# Patient Record
Sex: Male | Born: 1955 | Race: White | Hispanic: No | Marital: Married | Smoking: Never smoker
Health system: Southern US, Community
[De-identification: ages and names within clinical notes are randomized; demographics above are authoritative.]

## PROBLEM LIST (undated history)

## (undated) DIAGNOSIS — C801 Malignant (primary) neoplasm, unspecified: Secondary | ICD-10-CM

## (undated) DIAGNOSIS — M1990 Unspecified osteoarthritis, unspecified site: Secondary | ICD-10-CM

## (undated) DIAGNOSIS — F32A Depression, unspecified: Secondary | ICD-10-CM

## (undated) DIAGNOSIS — D689 Coagulation defect, unspecified: Secondary | ICD-10-CM

## (undated) HISTORY — DX: Unspecified osteoarthritis, unspecified site: M19.90

## (undated) HISTORY — PX: INGUINAL HERNIA REPAIR: SUR1180

## (undated) HISTORY — PX: ADENOIDECTOMY: SUR15

## (undated) HISTORY — PX: INGUINAL LYMPH NODE BIOPSY: SHX5865

## (undated) HISTORY — PX: TONSILLECTOMY: SUR1361

## (undated) HISTORY — DX: Coagulation defect, unspecified: D68.9

## (undated) HISTORY — DX: Depression, unspecified: F32.A

---

## 2007-04-03 ENCOUNTER — Ambulatory Visit: Payer: Self-pay | Admitting: General Surgery

## 2008-05-25 ENCOUNTER — Ambulatory Visit: Payer: Self-pay | Admitting: General Surgery

## 2015-01-06 ENCOUNTER — Ambulatory Visit: Payer: Self-pay | Admitting: Internal Medicine

## 2015-01-07 ENCOUNTER — Ambulatory Visit: Payer: Self-pay | Admitting: Internal Medicine

## 2015-01-09 ENCOUNTER — Ambulatory Visit: Payer: Self-pay | Admitting: Internal Medicine

## 2015-01-09 LAB — CBC WITH DIFFERENTIAL/PLATELET
BASOS ABS: 0 10*3/uL (ref 0.0–0.1)
Basophil %: 0.8 %
Eosinophil #: 0.2 10*3/uL (ref 0.0–0.7)
Eosinophil %: 3.1 %
HCT: 45.1 % (ref 40.0–52.0)
HGB: 15.4 g/dL (ref 13.0–18.0)
LYMPHS PCT: 23.9 %
Lymphocyte #: 1.3 10*3/uL (ref 1.0–3.6)
MCH: 31.2 pg (ref 26.0–34.0)
MCHC: 34.1 g/dL (ref 32.0–36.0)
MCV: 92 fL (ref 80–100)
MONOS PCT: 10.2 %
Monocyte #: 0.6 x10 3/mm (ref 0.2–1.0)
Neutrophil #: 3.4 10*3/uL (ref 1.4–6.5)
Neutrophil %: 62 %
PLATELETS: 181 10*3/uL (ref 150–440)
RBC: 4.93 10*6/uL (ref 4.40–5.90)
RDW: 13.6 % (ref 11.5–14.5)
WBC: 5.6 10*3/uL (ref 3.8–10.6)

## 2015-01-09 LAB — HEPATIC FUNCTION PANEL A (ARMC)
ALK PHOS: 98 U/L (ref 46–116)
ALT: 225 U/L — AB (ref 14–63)
Albumin: 2.9 g/dL — ABNORMAL LOW (ref 3.4–5.0)
BILIRUBIN DIRECT: 3 mg/dL — AB (ref 0.0–0.2)
BILIRUBIN TOTAL: 4 mg/dL — AB (ref 0.2–1.0)
SGOT(AST): 138 U/L — ABNORMAL HIGH (ref 15–37)
TOTAL PROTEIN: 6.9 g/dL (ref 6.4–8.2)

## 2015-01-09 LAB — BASIC METABOLIC PANEL
Anion Gap: 7 (ref 7–16)
BUN: 14 mg/dL (ref 7–18)
Calcium, Total: 8.7 mg/dL (ref 8.5–10.1)
Chloride: 102 mmol/L (ref 98–107)
Co2: 30 mmol/L (ref 21–32)
Creatinine: 0.95 mg/dL (ref 0.60–1.30)
EGFR (African American): >60
EGFR (Non-African Amer.): >60
Glucose: 95 mg/dL (ref 65–99)
OSMOLALITY: 278 (ref 275–301)
Potassium: 3.5 mmol/L (ref 3.5–5.1)
SODIUM: 139 mmol/L (ref 136–145)

## 2015-01-09 LAB — PROTIME-INR
INR: 0.9
Prothrombin Time: 12.4 secs

## 2015-01-09 LAB — GAMMA GT: GGT: 130 U/L — AB (ref 5–85)

## 2015-01-09 LAB — SEDIMENTATION RATE: ERYTHROCYTE SED RATE: 37 mm/h — AB (ref 0–20)

## 2015-01-11 DIAGNOSIS — R7989 Other specified abnormal findings of blood chemistry: Secondary | ICD-10-CM | POA: Insufficient documentation

## 2015-01-13 ENCOUNTER — Ambulatory Visit: Payer: Self-pay | Admitting: Gastroenterology

## 2019-09-07 ENCOUNTER — Other Ambulatory Visit (HOSPITAL_COMMUNITY): Payer: Self-pay | Admitting: Unknown Physician Specialty

## 2019-09-07 ENCOUNTER — Other Ambulatory Visit: Payer: Self-pay | Admitting: Unknown Physician Specialty

## 2019-09-07 DIAGNOSIS — R221 Localized swelling, mass and lump, neck: Secondary | ICD-10-CM

## 2019-09-15 ENCOUNTER — Ambulatory Visit
Admission: RE | Admit: 2019-09-15 | Discharge: 2019-09-15 | Disposition: A | Discharge status: Home / Self Care | Payer: Medicare Other | Source: Ambulatory Visit | Attending: Unknown Physician Specialty | Admitting: Unknown Physician Specialty

## 2019-09-15 ENCOUNTER — Other Ambulatory Visit: Payer: Self-pay

## 2019-09-15 DIAGNOSIS — R221 Localized swelling, mass and lump, neck: Secondary | ICD-10-CM | POA: Diagnosis not present

## 2019-09-15 LAB — POCT I-STAT CREATININE: Creatinine, Ser: 1 mg/dL (ref 0.61–1.24)

## 2019-09-15 MED ORDER — IOHEXOL 300 MG/ML  SOLN
75.0000 mL | Freq: Once | INTRAMUSCULAR | Status: AC | PRN
  Administered 2019-09-15: 14:00:00 75 mL via INTRAVENOUS

## 2019-09-19 ENCOUNTER — Inpatient Hospital Stay: Admit: 2019-09-19 | Payer: Medicare Other | Admitting: Internal Medicine

## 2019-09-21 ENCOUNTER — Other Ambulatory Visit: Payer: Self-pay | Admitting: Unknown Physician Specialty

## 2019-09-21 DIAGNOSIS — R590 Localized enlarged lymph nodes: Secondary | ICD-10-CM

## 2019-09-29 ENCOUNTER — Other Ambulatory Visit: Payer: Self-pay | Admitting: Physician Assistant

## 2019-09-30 ENCOUNTER — Ambulatory Visit
Admission: RE | Admit: 2019-09-30 | Discharge: 2019-09-30 | Disposition: A | Discharge status: Home / Self Care | Payer: Medicare Other | Source: Ambulatory Visit | Attending: Unknown Physician Specialty | Admitting: Unknown Physician Specialty

## 2019-09-30 ENCOUNTER — Other Ambulatory Visit: Payer: Self-pay

## 2019-09-30 DIAGNOSIS — C969 Malignant neoplasm of lymphoid, hematopoietic and related tissue, unspecified: Secondary | ICD-10-CM | POA: Insufficient documentation

## 2019-09-30 DIAGNOSIS — R590 Localized enlarged lymph nodes: Secondary | ICD-10-CM

## 2019-09-30 MED ORDER — SODIUM CHLORIDE 0.9 % IV SOLN: INTRAVENOUS | Status: DC

## 2019-09-30 NOTE — Procedures (Signed)
Interventional Radiology Procedure:   Indications:  2.5 cm mass at the right glossotonsillar sulcus and cervical lymphadenopathy  Procedure: US guided biopsy of right cervical lymph node  Findings: Several enlarged lymph nodes on right side of neck.  6 cores obtained from one node.   Complications: None     EBL: Minimal, less than 10 ml  Plan: Discharge to home.     Nava Song R. Anselm Pancoast, MD  Pager: [phone]

## 2019-10-01 DIAGNOSIS — C099 Malignant neoplasm of tonsil, unspecified: Secondary | ICD-10-CM | POA: Insufficient documentation

## 2019-10-01 NOTE — Progress Notes (Signed)
[place]  [phone]) [phone] Fax:(336) [phone]  ID: SHAWAN CORELLA OB: 1956/01/24  MR#: [id number]  WHQ#:[id number]  Patient Care Team: Albina Billet, MD as PCP - General (Internal Medicine)  CHIEF COMPLAINT: Right tonsil squamous carcinoma.  INTERVAL HISTORY: Patient is a 63 year old male who was having a persistent sore throat and increased cervical lymphadenopathy.  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  He is anxious, but otherwise feels well.  He has no neurologic complaints.  He denies any recent fevers or illnesses.  He has a good appetite and denies weight loss.  He has no chest pain, shortness of breath, cough, or hemoptysis.  He has no nausea, vomiting, constipation, or diarrhea.  He has no urinary complaints.  Patient feels at his baseline offers no specific complaints today.  REVIEW OF SYSTEMS:   Review of Systems  Constitutional: Negative.  Negative for fever, malaise/fatigue and weight loss.  Respiratory: Negative.  Negative for cough, hemoptysis and shortness of breath.   Cardiovascular: Negative.  Negative for chest pain and leg swelling.  Gastrointestinal: Negative.  Negative for abdominal pain.  Genitourinary: Negative.  Negative for dysuria.  Musculoskeletal: Negative.  Negative for back pain.  Skin: Negative.  Negative for rash.  Neurological: Negative.  Negative for dizziness, focal weakness, weakness and headaches.  Psychiatric/Behavioral: Negative.  The patient is not nervous/anxious.     As per HPI. Otherwise, a complete review of systems is negative.  PAST MEDICAL HISTORY: History reviewed. No pertinent past medical history.  PAST SURGICAL HISTORY: Past Surgical History:  Procedure Laterality Date   ADENOIDECTOMY     INGUINAL HERNIA REPAIR Left    INGUINAL LYMPH NODE BIOPSY Right    removal of lymph node   TONSILLECTOMY      FAMILY HISTORY: Family History  Problem Relation Age  of Onset   Multiple myeloma Brother     ADVANCED DIRECTIVES (Y/N):  N  HEALTH MAINTENANCE: Social History   Tobacco Use   Smoking status: Never Smoker   Smokeless tobacco: Never Used  Substance Use Topics   Alcohol use: Yes   Drug use: Not Currently     Colonoscopy:  PAP:  Bone density:  Lipid panel:  No Known Allergies  No current outpatient medications on file.   No current facility-administered medications for this visit.     OBJECTIVE: Vitals:   10/02/19 0950  BP: 134/70  Pulse: 63  Resp: 18  Temp: 98.1 F (36.7 C)     Body mass index is 31.52 kg/m.    ECOG FS:0 - Asymptomatic  General: Well-developed, well-nourished, no acute distress. Eyes: Pink conjunctiva, anicteric sclera. HEENT: Normocephalic, moist mucous membranes, clear oropharnyx.  No palpable lymphadenopathy. Lungs: Clear to auscultation bilaterally. Heart: Regular rate and rhythm. No rubs, murmurs, or gallops. Abdomen: Soft, nontender, nondistended. No organomegaly noted, normoactive bowel sounds. Musculoskeletal: No edema, cyanosis, or clubbing. Neuro: Alert, answering all questions appropriately. Cranial nerves grossly intact. Skin: No rashes or petechiae noted. Psych: Normal affect.  LAB RESULTS:  Lab Results  Component Value Date   NA 140 10/02/2019   K 4.5 10/02/2019   CL 107 10/02/2019   CO2 27 10/02/2019   GLUCOSE 101 (H) 10/02/2019   BUN 12 10/02/2019   CREATININE 0.90 10/02/2019   CALCIUM 9.2 10/02/2019   PROT 7.1 10/02/2019   ALBUMIN 4.3 10/02/2019   AST 22 10/02/2019   ALT 21 10/02/2019   ALKPHOS 58 10/02/2019   BILITOT 1.0 10/02/2019  GFRNONAA >60 10/02/2019   GFRAA >60 10/02/2019    Lab Results  Component Value Date   WBC 6.0 10/02/2019   NEUTROABS 4.1 10/02/2019   HGB 15.9 10/02/2019   HCT 45.7 10/02/2019   MCV 90.1 10/02/2019   PLT 161 10/02/2019     STUDIES: Ct Soft Tissue Neck W Contrast  Result Date: 09/16/2019 CLINICAL DATA:  Right  submandibular mass for 1 month EXAM: CT NECK WITH CONTRAST TECHNIQUE: Multidetector CT imaging of the neck was performed using the standard protocol following the bolus administration of intravenous contrast. CONTRAST:  38m OMNIPAQUE IOHEXOL 300 MG/ML  SOLN COMPARISON:  None. FINDINGS: Pharynx and larynx: 2.5 cm enhancing mass at the right glossotonsillar sulcus with submucosal extension into the tongue musculature. No bony or carotid space invasion. Salivary glands: Negative for mass or inflammation. Thyroid: Subcentimeter right thyroid nodule, incidental Lymph nodes: 4 enlarged and rounded right upper jugular chain lymph nodes measuring up to 17 mm in diameter. No contralateral adenopathy is seen. Vascular: Negative Limited intracranial: Negative Visualized orbits: Negative Mastoids and visualized paranasal sinuses: Polypoid densities in the bilateral nasal cavity. There has been endoscopic sinus surgery previously. Skeleton: No acute or aggressive finding. Cervical degenerative disc narrowing. Upper chest: Negative IMPRESSION: 2.5 cm mass at the right glossotonsillar sulcus consistent with carcinoma. There are multiple ipsilateral lymph nodes in the upper jugular chain measuring up to 17 mm. Prior endoscopic sinus surgery.  There are bilateral nasal polyps. Electronically Signed   By: JMonte FantasiaM.D.   On: 09/16/2019 08:47   [place] Biopsy (lymph Nodes)  Result Date: 09/30/2019 INDICATION: 63year old with a mass at the right glossotonsillar sulcus and right cervical lymphadenopathy. Tissue diagnosis is needed. EXAM: ULTRASOUND-GUIDED RIGHT CERVICAL LYMPH NODE BIOPSY MEDICATIONS: None. ANESTHESIA/SEDATION: None FLUOROSCOPY TIME:  None COMPLICATIONS: None immediate. PROCEDURE: Informed written consent was obtained from the patient after a thorough discussion of the procedural risks, benefits and alternatives. All questions were addressed. A timeout was performed prior to the initiation of the procedure.  Ultrasound was used to evaluate the right side of the neck. Several abnormal hypoechoic lymph nodes were identified. Lymph node superficial to the right internal jugular vein and right carotid artery was targeted. Skin was prepped with chlorhexidine and sterile field was created. Skin was anesthetized with 1% lidocaine. Using ultrasound guidance, core biopsies were obtained from the targeted lymph node with an 18 gauge device. Specimens placed on Telfa pad with saline. Bandage placed over the puncture site. FINDINGS: Several rounded hypoechoic lymph nodes in the right neck. Six core biopsies obtained and no immediate bleeding or hematoma formation. IMPRESSION: Ultrasound-guided core biopsies of a right cervical lymph node. Electronically Signed   By: AMarkus DaftM.D.   On: 09/30/2019 11:10    ASSESSMENT: Right tonsil squamous carcinoma.  PLAN:    1. Right tonsil squamous carcinoma: CT scan results from September 16, 2019 reviewed independently and reported as above.  Biopsy confirmed squamous cell carcinoma.  Patient appears to be stage IVa which can be treated with weekl cisplatin concurrently with XRT.  Will get a PET scan in the next week to complete the staging work-up.  A referral to radiation oncology has also been placed.  Patient will have a video assisted telemedicine visit 1 to 2 days after his PET scan to discuss the results and additional treatment planning.  I spent a total of 60 minutes face-to-face with the patient of which greater than 50% of the visit was spent in counseling and  coordination of care as detailed above.   Patient expressed understanding and was in agreement with this plan. He also understands that He can call clinic at any time with any questions, concerns, or complaints.   Cancer Staging No matching staging information was found for the patient.  Lloyd Huger, MD   10/03/2019 6:57 AM

## 2019-10-02 ENCOUNTER — Inpatient Hospital Stay: Payer: Medicare Other

## 2019-10-02 ENCOUNTER — Encounter: Payer: Self-pay | Admitting: Oncology

## 2019-10-02 ENCOUNTER — Other Ambulatory Visit: Payer: Self-pay

## 2019-10-02 ENCOUNTER — Inpatient Hospital Stay: Payer: Medicare Other | Attending: Oncology | Admitting: Oncology

## 2019-10-02 DIAGNOSIS — C099 Malignant neoplasm of tonsil, unspecified: Secondary | ICD-10-CM | POA: Insufficient documentation

## 2019-10-02 LAB — COMPREHENSIVE METABOLIC PANEL
ALT: 21 U/L (ref 0–44)
AST: 22 U/L (ref 15–41)
Albumin: 4.3 g/dL (ref 3.5–5.0)
Alkaline Phosphatase: 58 U/L (ref 38–126)
Anion gap: 6 (ref 5–15)
BUN: 12 mg/dL (ref 8–23)
CO2: 27 mmol/L (ref 22–32)
Calcium: 9.2 mg/dL (ref 8.9–10.3)
Chloride: 107 mmol/L (ref 98–111)
Creatinine, Ser: 0.9 mg/dL (ref 0.61–1.24)
GFR calc Af Amer: >60 mL/min (ref >60)
GFR calc non Af Amer: >60 mL/min (ref >60)
Glucose, Bld: 101 mg/dL — ABNORMAL HIGH (ref 70–99)
Potassium: 4.5 mmol/L (ref 3.5–5.1)
Sodium: 140 mmol/L (ref 135–145)
Total Bilirubin: 1 mg/dL (ref 0.3–1.2)
Total Protein: 7.1 g/dL (ref 6.5–8.1)

## 2019-10-02 LAB — CBC WITH DIFFERENTIAL/PLATELET
Abs Immature Granulocytes: 0.02 10*3/uL (ref 0.00–0.07)
Basophils Absolute: 0 10*3/uL (ref 0.0–0.1)
Basophils Relative: 1 %
Eosinophils Absolute: 0.1 10*3/uL (ref 0.0–0.5)
Eosinophils Relative: 2 %
HCT: 45.7 % (ref 39.0–52.0)
Hemoglobin: 15.9 g/dL (ref 13.0–17.0)
Immature Granulocytes: 0 %
Lymphocytes Relative: 19 %
Lymphs Abs: 1.1 10*3/uL (ref 0.7–4.0)
MCH: 31.4 pg (ref 26.0–34.0)
MCHC: 34.8 g/dL (ref 30.0–36.0)
MCV: 90.1 fL (ref 80.0–100.0)
Monocytes Absolute: 0.6 10*3/uL (ref 0.1–1.0)
Monocytes Relative: 10 %
Neutro Abs: 4.1 10*3/uL (ref 1.7–7.7)
Neutrophils Relative %: 68 %
Platelets: 161 10*3/uL (ref 150–400)
RBC: 5.07 MIL/uL (ref 4.22–5.81)
RDW: 13 % (ref 11.5–15.5)
WBC: 6 10*3/uL (ref 4.0–10.5)
nRBC: 0 % (ref 0.0–0.2)

## 2019-10-02 LAB — SURGICAL PATHOLOGY

## 2019-10-02 NOTE — Progress Notes (Signed)
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

## 2019-10-04 NOTE — Progress Notes (Signed)
[place]  Telephone:(336) [phone] Fax:(336) [phone]  ID: Bryan Gomez OB: [birth year]  MR#: [id number]  RKY#:[id number]  Patient Care Team: Albina Billet, MD as PCP - General (Internal Medicine)  I connected with Bryan Gomez on 10/10/19 at  9:45 AM EDT by video enabled telemedicine visit and verified that I am speaking with the correct person using two identifiers.   I discussed the limitations, risks, security and privacy concerns of performing an evaluation and management service by telemedicine and the availability of in-person appointments. I also discussed with the patient that there may be a patient responsible charge related to this service. The patient expressed understanding and agreed to proceed.   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  INTERVAL HISTORY: Patient agreed to video enabled telemedicine visit for discussion of his imaging results and treatment planning.  He currently feels well and is asymptomatic.  He has no neurologic complaints.  He denies any recent fevers or illnesses.  He has a good appetite and denies weight loss.  He has no chest pain, shortness of breath, cough, or hemoptysis.  He has no nausea, vomiting, constipation, or diarrhea.  He has no urinary complaints.  Patient offers no specific complaints today.  REVIEW OF SYSTEMS:   Review of Systems  Constitutional: Negative.  Negative for fever, malaise/fatigue and weight loss.  Respiratory: Negative.  Negative for cough, hemoptysis and shortness of breath.   Cardiovascular: Negative.  Negative for chest pain and leg swelling.  Gastrointestinal: Negative.  Negative for abdominal pain.  Genitourinary: Negative.  Negative for dysuria.  Musculoskeletal: Negative.  Negative for back pain.  Skin:  Negative.  Negative for rash.  Neurological: Negative.  Negative for dizziness, focal weakness, weakness and headaches.  Psychiatric/Behavioral: Negative.  The patient is not nervous/anxious.     As per HPI. Otherwise, a complete review of systems is negative.  PAST MEDICAL HISTORY: No past medical history on file.  PAST SURGICAL HISTORY: Past Surgical History:  Procedure Laterality Date   ADENOIDECTOMY     INGUINAL HERNIA REPAIR Left    INGUINAL LYMPH NODE BIOPSY Right    removal of lymph node   TONSILLECTOMY      FAMILY HISTORY: Family History  Problem Relation Age of Onset   Multiple myeloma Brother     ADVANCED DIRECTIVES (Y/N):  N  HEALTH MAINTENANCE: Social History   Tobacco Use   Smoking status: Never Smoker   Smokeless tobacco: Never Used  Substance Use Topics   Alcohol use: Yes   Drug use: Not Currently     Colonoscopy:  PAP:  Bone density:  Lipid panel:  No Known Allergies  Current Outpatient Medications  Medication Sig Dispense Refill   chlorpheniramine (CHLOR-TRIMETON) 2 MG/5ML syrup Take 2 mg by mouth every 4 (four) hours as needed for allergies.     diclofenac sodium (VOLTAREN) 1 % GEL Voltaren 1 % topical gel  APPLY 2 GRAM TO THE AFFECTED AREA(S) BY TOPICAL ROUTE 4 TIMES PER DAY     SILDENAFIL CITRATE PO CHEW 1 TO 2 TABLETS BY MOUTH 30 TO 45 MINUTES BEFORE SEXUAL ACTIVITY AS NEEDED     ondansetron (ZOFRAN) 8 MG tablet Take 1 tablet (8 mg total) by mouth 2 (two) times daily as needed. 60 tablet 1   prochlorperazine (COMPAZINE) 10 MG tablet Take  1 tablet (10 mg total) by mouth every 6 (six) hours as needed (Nausea or vomiting). 60 tablet 1   No current facility-administered medications for this visit.     OBJECTIVE: There were no vitals filed for this visit.   There is no height or weight on file to calculate BMI.    ECOG FS:0 - Asymptomatic  General: Well-developed, well-nourished, no acute distress. HEENT:  Normocephali. Neuro: Alert, answering all questions appropriately. Cranial nerves grossly intact. Psych: Normal affect.  LAB RESULTS:  Lab Results  Component Value Date   NA 140 10/02/2019   K 4.5 10/02/2019   CL 107 10/02/2019   CO2 27 10/02/2019   GLUCOSE 101 (H) 10/02/2019   BUN 12 10/02/2019   CREATININE 0.90 10/02/2019   CALCIUM 9.2 10/02/2019   PROT 7.1 10/02/2019   ALBUMIN 4.3 10/02/2019   AST 22 10/02/2019   ALT 21 10/02/2019   ALKPHOS 58 10/02/2019   BILITOT 1.0 10/02/2019   GFRNONAA >60 10/02/2019   GFRAA >60 10/02/2019    Lab Results  Component Value Date   WBC 6.0 10/02/2019   NEUTROABS 4.1 10/02/2019   HGB 15.9 10/02/2019   HCT 45.7 10/02/2019   MCV 90.1 10/02/2019   PLT 161 10/02/2019     STUDIES: Ct Soft Tissue Neck W Contrast  Result Date: 09/16/2019 CLINICAL DATA:  Right submandibular mass for 1 month EXAM: CT NECK WITH CONTRAST TECHNIQUE: Multidetector CT imaging of the neck was performed using the standard protocol following the bolus administration of intravenous contrast. CONTRAST:  46m OMNIPAQUE IOHEXOL 300 MG/ML  SOLN COMPARISON:  None. FINDINGS: Pharynx and larynx: 2.5 cm enhancing mass at the right glossotonsillar sulcus with submucosal extension into the tongue musculature. No bony or carotid space invasion. Salivary glands: Negative for mass or inflammation. Thyroid: Subcentimeter right thyroid nodule, incidental Lymph nodes: 4 enlarged and rounded right upper jugular chain lymph nodes measuring up to 17 mm in diameter. No contralateral adenopathy is seen. Vascular: Negative Limited intracranial: Negative Visualized orbits: Negative Mastoids and visualized paranasal sinuses: Polypoid densities in the bilateral nasal cavity. There has been endoscopic sinus surgery previously. Skeleton: No acute or aggressive finding. Cervical degenerative disc narrowing. Upper chest: Negative IMPRESSION: 2.5 cm mass at the right glossotonsillar sulcus consistent with  carcinoma. There are multiple ipsilateral lymph nodes in the upper jugular chain measuring up to 17 mm. Prior endoscopic sinus surgery.  There are bilateral nasal polyps. Electronically Signed   By: JMonte FantasiaM.D.   On: 09/16/2019 08:47   Nm Pet Image Initial (pi) Skull Base To Thigh  Result Date: 10/07/2019 CLINICAL DATA:  Initial treatment strategy for head and neck cancer. EXAM: NUCLEAR MEDICINE PET SKULL BASE TO THIGH TECHNIQUE: 12.4 mCi F-18 FDG was injected intravenously. Full-ring PET imaging was performed from the skull base to thigh after the radiotracer. CT data was obtained and used for attenuation correction and anatomic localization. Fasting blood glucose: 100 mg/dl COMPARISON:  CT neck 09/15/2019 FINDINGS: Mediastinal blood pool activity: SUV max 2.68 Liver activity: SUV max NA NECK: Asymmetric increased uptake localizing to the right glossotonsillar sulcus mass has an SUV max of 12.24. Multiple FDG avid right cervical lymph nodes identified. Index right level 2 node measures 2 cm and has an SUV max of 6.8. No FDG avid left cervical lymph nodes. Incidental CT findings: none CHEST: No hypermetabolic mediastinal or hilar nodes. No suspicious pulmonary nodules on the CT scan. Incidental CT findings: Dependent changes and subsegmental atelectasis noted in the posterior  lung bases. Lad coronary artery calcifications. Aortic atherosclerosis. ABDOMEN/PELVIS: No abnormal hypermetabolic activity within the liver, pancreas, adrenal glands, or spleen. No hypermetabolic lymph nodes in the abdomen or pelvis. Incidental CT findings: Fat containing umbilical hernia. Left kidney stones. Aortic atherosclerosis. SKELETON: No focal hypermetabolic activity to suggest skeletal metastasis. Incidental CT findings: none IMPRESSION: 1. There is marked increased radiotracer uptake localizing to the right glossotonsillar sulcus mass identified on recent CT. SUV max is equal to 12.24. 2. Multiple right cervical lymph  nodes are FDG avid compatible with metastatic adenopathy. No FDG avid left cervical lymph nodes or distant metastatic disease. 3. Left kidney stones. 4. Lad coronary artery atherosclerotic calcifications. Electronically Signed   By: Kerby Moors M.D.   On: 10/07/2019 11:40   [place] Core Biopsy (lymph Nodes)  Result Date: 09/30/2019 INDICATION: 63 year old with a mass at the right glossotonsillar sulcus and right cervical lymphadenopathy. Tissue diagnosis is needed. EXAM: ULTRASOUND-GUIDED RIGHT CERVICAL LYMPH NODE BIOPSY MEDICATIONS: None. ANESTHESIA/SEDATION: None FLUOROSCOPY TIME:  None COMPLICATIONS: None immediate. PROCEDURE: Informed written consent was obtained from the patient after a thorough discussion of the procedural risks, benefits and alternatives. All questions were addressed. A timeout was performed prior to the initiation of the procedure. Ultrasound was used to evaluate the right side of the neck. Several abnormal hypoechoic lymph nodes were identified. Lymph node superficial to the right internal jugular vein and right carotid artery was targeted. Skin was prepped with chlorhexidine and sterile field was created. Skin was anesthetized with 1% lidocaine. Using ultrasound guidance, core biopsies were obtained from the targeted lymph node with an 18 gauge device. Specimens placed on Telfa pad with saline. Bandage placed over the puncture site. FINDINGS: Several rounded hypoechoic lymph nodes in the right neck. Six core biopsies obtained and no immediate bleeding or hematoma formation. IMPRESSION: Ultrasound-guided core biopsies of a right cervical lymph node. Electronically Signed   By: Markus Daft M.D.   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  PLAN:    1. 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  Patient has no  obvious metastatic disease.  He recently had consultation with radiation oncology and plan is to initiate concurrent weekly cisplatin 40 mg/m along with daily XRT.  Return to clinic on October 21, 2019 to initiate cycle 1 of weekly cisplatin.    I provided 25 minutes of face-to-face video visit time during this encounter, and > 50% was spent counseling as documented under my assessment & plan.   Patient expressed understanding and was in agreement with this plan. He also understands that He can call clinic at any time with any questions, concerns, or complaints.    Lloyd Huger, MD   10/10/2019 7:14 AM

## 2019-10-07 ENCOUNTER — Encounter
Admission: RE | Admit: 2019-10-07 | Discharge: 2019-10-07 | Disposition: A | Discharge status: Home / Self Care | Payer: Medicare Other | Source: Ambulatory Visit | Attending: Oncology | Admitting: Oncology

## 2019-10-07 ENCOUNTER — Other Ambulatory Visit: Payer: Self-pay

## 2019-10-07 DIAGNOSIS — I2510 Atherosclerotic heart disease of native coronary artery without angina pectoris: Secondary | ICD-10-CM | POA: Diagnosis not present

## 2019-10-07 DIAGNOSIS — C099 Malignant neoplasm of tonsil, unspecified: Secondary | ICD-10-CM | POA: Diagnosis present

## 2019-10-07 DIAGNOSIS — N200 Calculus of kidney: Secondary | ICD-10-CM | POA: Insufficient documentation

## 2019-10-07 LAB — GLUCOSE, CAPILLARY: Glucose-Capillary: 100 mg/dL — ABNORMAL HIGH (ref 70–99)

## 2019-10-07 MED ORDER — FLUDEOXYGLUCOSE F - 18 (FDG) INJECTION
12.7000 | Freq: Once | INTRAVENOUS | Status: AC | PRN
  Administered 2019-10-07: 12.4 via INTRAVENOUS

## 2019-10-08 ENCOUNTER — Ambulatory Visit
Admission: RE | Admit: 2019-10-08 | Discharge: 2019-10-08 | Disposition: A | Discharge status: Home / Self Care | Payer: Medicare Other | Source: Ambulatory Visit | Attending: Radiation Oncology | Admitting: Radiation Oncology

## 2019-10-08 ENCOUNTER — Encounter: Payer: Self-pay | Admitting: Radiation Oncology

## 2019-10-08 ENCOUNTER — Other Ambulatory Visit: Payer: Self-pay

## 2019-10-08 ENCOUNTER — Encounter: Payer: Self-pay | Admitting: Oncology

## 2019-10-08 VITALS — BP 136/63 | HR 58 | Temp 97.8°F | Resp 16 | Wt 248.9 lb

## 2019-10-08 DIAGNOSIS — C099 Malignant neoplasm of tonsil, unspecified: Secondary | ICD-10-CM | POA: Insufficient documentation

## 2019-10-08 DIAGNOSIS — Z79899 Other long term (current) drug therapy: Secondary | ICD-10-CM | POA: Insufficient documentation

## 2019-10-08 NOTE — Progress Notes (Signed)
Patient is here for follow up via telephone call, he would like to discuss results of his PET scan

## 2019-10-08 NOTE — Consult Note (Signed)
NEW PATIENT EVALUATION  Name: Bryan Gomez  MRN: [id number]  Date:   10/08/2019     DOB: [birth year]   This 63 y.o. 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  REFERRING PHYSICIAN: Albina Billet, MD  CHIEF COMPLAINT:  Chief Complaint  Patient presents with  . Cancer    INitial consultation    DIAGNOSIS: The encounter diagnosis was Right tonsillar squamous cell carcinoma ([place]).   PREVIOUS INVESTIGATIONS:  PET/CT and CT scans reviewed Clinical notes reviewed Surgical pathology report reviewed  HPI: Patient is a 63 year old male who presented with a several month history of persistent sore throat.  He also noticed lymph nodes in his right neck.  He was seen by ENT CT scan was performed showing a 2.5 cm mass in the right glossotonsillar sulcus consistent with carcinoma.  There are also multiple ipsilateral lymph nodes in the upper jugular chain on the right.  Patient underwent ultrasound-guided biopsy of cervical lymph node which was positive for metastatic squamous cell carcinoma.  Tumor is strongly and diffusely positive for p16.  PET scan was performed showing a hypermetabolic activity in the right tonsillar region with an SUV of 12.2.  There was also multiple right cervical lymph nodes hypermetabolic compatible with metastatic adenopathy.  No left cervical lymph nodes or distant metastatic disease was seen.  Patient been seen by medical oncology and plan is for concurrent chemoradiation he is seen today for radiation oncology opinion he is doing fairly well he states his throat is still slightly sore.  He is having no dysphagia.  PLANNED TREATMENT REGIMEN: Concurrent chemoradiation  PAST MEDICAL HISTORY:  has no past medical history on file.    PAST SURGICAL HISTORY:  Past Surgical History:  Procedure Laterality Date  . ADENOIDECTOMY    . INGUINAL HERNIA REPAIR Left   .  INGUINAL LYMPH NODE BIOPSY Right    removal of lymph node  . TONSILLECTOMY      FAMILY HISTORY: family history includes Multiple myeloma in his brother.  SOCIAL HISTORY:  reports that he has never smoked. He has never used smokeless tobacco. He reports current alcohol use. He reports previous drug use.  ALLERGIES: Patient has no known allergies.  MEDICATIONS:  Current Outpatient Medications  Medication Sig Dispense Refill  . chlorpheniramine (CHLOR-TRIMETON) 2 MG/5ML syrup Take 2 mg by mouth every 4 (four) hours as needed for allergies.    Marland Kitchen diclofenac sodium (VOLTAREN) 1 % GEL Voltaren 1 % topical gel  APPLY 2 GRAM TO THE AFFECTED AREA(S) BY TOPICAL ROUTE 4 TIMES PER DAY    . SILDENAFIL CITRATE PO CHEW 1 TO 2 TABLETS BY MOUTH 30 TO 45 MINUTES BEFORE SEXUAL ACTIVITY AS NEEDED     No current facility-administered medications for this encounter.     ECOG PERFORMANCE STATUS:  1 - Symptomatic but completely ambulatory  REVIEW OF SYSTEMS: Patient denies any weight loss, fatigue, weakness, fever, chills or night sweats. Patient denies any loss of vision, blurred vision. Patient denies any ringing  of the ears or hearing loss. No irregular heartbeat. Patient denies heart murmur or history of fainting. Patient denies any chest pain or pain radiating to her upper extremities. Patient denies any shortness of breath, difficulty breathing at night, cough or hemoptysis. Patient denies any swelling in the lower legs. Patient denies any nausea vomiting, vomiting of blood, or coffee ground material in the vomitus. Patient denies  any stomach pain. Patient states has had normal bowel movements no significant constipation or diarrhea. Patient denies any dysuria, hematuria or significant nocturia. Patient denies any problems walking, swelling in the joints or loss of balance. Patient denies any skin changes, loss of hair or loss of weight. Patient denies any excessive worrying or anxiety or significant  depression. Patient denies any problems with insomnia. Patient denies excessive thirst, polyuria, polydipsia. Patient denies any swollen glands, patient denies easy bruising or easy bleeding. Patient denies any recent infections, allergies or URI. Patient "s visual fields have not changed significantly in recent time.   PHYSICAL EXAM: BP 136/63 (BP Location: Left Arm, Patient Position: Sitting)   Pulse (!) 58   Temp 97.8 F (36.6 C) (Tympanic)   Resp 16   Wt 248 lb 14.4 oz (112.9 kg)   BMI 31.96 kg/m  Oral cavity shows masslike effect in the region of the right tonsil.  He also has multiple scattered lymph nodes in the right cervical chain left neck is clear.  Well-developed well-nourished patient in NAD. HEENT reveals PERLA, EOMI, discs not visualized.  Oral cavity is clear. No oral mucosal lesions are identified. Neck is clear without evidence of cervical or supraclavicular adenopathy. Lungs are clear to A&P. Cardiac examination is essentially unremarkable with regular rate and rhythm without murmur rub or thrill. Abdomen is benign with no organomegaly or masses noted. Motor sensory and DTR levels are equal and symmetric in the upper and lower extremities. Cranial nerves II through XII are grossly intact. Proprioception is intact. No peripheral adenopathy or edema is identified. No motor or sensory levels are noted. Crude visual fields are within normal range.  LABORATORY DATA: Pathology report reviewed    RADIOLOGY RESULTS: CT scans and PET CT scans reviewed   IMPRESSION: Stage IVa squamous cell carcinoma of the right tonsillar pillar in 63 year old male  PLAN: At this time I recommended IMRT radiation therapy to his right tonsillar region as well as right cervical chain.  I believe I can spare the left neck as most recent studies have shown inclusion of a unilateral squamous cell carcinoma of the tonsil with ipsilateral neck metastasis can spare the contralateral side without significant  risk of local regional failure.  I would plan on delivering 7000 cGy area of primary tumor as well as hypermetabolic lymph nodes treating the remainder of the right cervical chain to 5400 cGy using IMRT dose painting technique.  Risks and benefits of treatment including skin reaction fatigue possible dysphagia possible oral mucositis loss of taste all were described in detail to the patient.  He seems to comprehend my treatment plan well.  I have set up simulation for next week.  We will also coordinate his chemotherapy.  There will be extra effort by both professional staff as well as technical staff to coordinate and manage concurrent chemoradiation and ensuing side effects during his treatments. Patient comprehends my treatment plan well.  I would like to take this opportunity to thank you for allowing me to participate in the care of your patient.Noreene Filbert, MD

## 2019-10-09 ENCOUNTER — Inpatient Hospital Stay (HOSPITAL_BASED_OUTPATIENT_CLINIC_OR_DEPARTMENT_OTHER): Payer: Medicare Other | Admitting: Oncology

## 2019-10-09 DIAGNOSIS — C099 Malignant neoplasm of tonsil, unspecified: Secondary | ICD-10-CM | POA: Diagnosis not present

## 2019-10-10 MED ORDER — PROCHLORPERAZINE MALEATE 10 MG PO TABS: 10.0000 mg | ORAL_TABLET | Freq: Four times a day (QID) | ORAL | 1 refills | Status: DC | PRN

## 2019-10-10 MED ORDER — ONDANSETRON HCL 8 MG PO TABS: 8.0000 mg | ORAL_TABLET | Freq: Two times a day (BID) | ORAL | 1 refills | Status: DC | PRN

## 2019-10-10 NOTE — Progress Notes (Signed)
START ON PATHWAY REGIMEN - Head and Neck     A cycle is every 7 days:     Cisplatin   **Always confirm dose/schedule in your pharmacy ordering system**  Patient Characteristics: Oropharynx, HPV Negative/Unknown, Clinically Staged, Stage IV Disease Classification: Oropharynx HPV Status: Awaiting Test Results Current Disease Status: No Distant Metastases and No Recurrent Disease AJCC T Category: T2 AJCC 8 Stage Grouping: IVA AJCC N Category: cN2c AJCC M Category: M0 Intent of Therapy: Curative Intent, Discussed with Patient

## 2019-10-12 ENCOUNTER — Telehealth: Payer: Self-pay | Admitting: Oncology

## 2019-10-12 NOTE — Telephone Encounter (Signed)
Left pt Vm about Chemo Education class scheduled for 10/15/2019. Also, informed pt that all apts could be viewed in his Mychart andthat I would leava copy of appt in [place] for hin to retrieve when he comes to his appt on 10/13/2019.

## 2019-10-13 ENCOUNTER — Other Ambulatory Visit: Payer: Self-pay

## 2019-10-13 ENCOUNTER — Ambulatory Visit
Admission: RE | Admit: 2019-10-13 | Discharge: 2019-10-13 | Disposition: A | Discharge status: Home / Self Care | Payer: Medicare Other | Source: Ambulatory Visit | Attending: Radiation Oncology | Admitting: Radiation Oncology

## 2019-10-13 DIAGNOSIS — Z510 Encounter for antineoplastic radiation therapy: Secondary | ICD-10-CM | POA: Insufficient documentation

## 2019-10-13 DIAGNOSIS — C099 Malignant neoplasm of tonsil, unspecified: Secondary | ICD-10-CM | POA: Diagnosis present

## 2019-10-14 ENCOUNTER — Other Ambulatory Visit: Payer: Self-pay

## 2019-10-14 DIAGNOSIS — Z510 Encounter for antineoplastic radiation therapy: Secondary | ICD-10-CM | POA: Diagnosis not present

## 2019-10-14 NOTE — Patient Instructions (Signed)
Cisplatin injection What is this medicine? CISPLATIN (SIS pla tin) is a chemotherapy drug. It targets fast dividing cells, like cancer cells, and causes these cells to die. This medicine is used to treat many types of cancer like bladder, ovarian, and testicular cancers. This medicine may be used for other purposes; ask your health care provider or pharmacist if you have questions. COMMON BRAND NAME(S): Platinol, Platinol -AQ What should I tell my health care provider before I take this medicine? They need to know if you have any of these conditions:  blood disorders  hearing problems  kidney disease  recent or ongoing radiation therapy  an unusual or allergic reaction to cisplatin, carboplatin, other chemotherapy, other medicines, foods, dyes, or preservatives  pregnant or trying to get pregnant  breast-feeding How should I use this medicine? This drug is given as an infusion into a vein. It is administered in a hospital or clinic by a specially trained health care professional. Talk to your pediatrician regarding the use of this medicine in children. Special care may be needed. Overdosage: If you think you have taken too much of this medicine contact a poison control center or emergency room at once. NOTE: This medicine is only for you. Do not share this medicine with others. What if I miss a dose? It is important not to miss a dose. Call your doctor or health care professional if you are unable to keep an appointment. What may interact with this medicine?  dofetilide  foscarnet  medicines for seizures  medicines to increase blood counts like filgrastim, pegfilgrastim, sargramostim  probenecid  pyridoxine used with altretamine  rituximab  some antibiotics like amikacin, gentamicin, neomycin, polymyxin B, streptomycin, tobramycin  sulfinpyrazone  vaccines  zalcitabine Talk to your doctor or health care professional before taking any of these  medicines:  acetaminophen  aspirin  ibuprofen  ketoprofen  naproxen This list may not describe all possible interactions. Give your health care provider a list of all the medicines, herbs, non-prescription drugs, or dietary supplements you use. Also tell them if you smoke, drink alcohol, or use illegal drugs. Some items may interact with your medicine. What should I watch for while using this medicine? Your condition will be monitored carefully while you are receiving this medicine. You will need important blood work done while you are taking this medicine. This drug may make you feel generally unwell. This is not uncommon, as chemotherapy can affect healthy cells as well as cancer cells. Report any side effects. Continue your course of treatment even though you feel ill unless your doctor tells you to stop. In some cases, you may be given additional medicines to help with side effects. Follow all directions for their use. Call your doctor or health care professional for advice if you get a fever, chills or sore throat, or other symptoms of a cold or flu. Do not treat yourself. This drug decreases your body's ability to fight infections. Try to avoid being around people who are sick. This medicine may increase your risk to bruise or bleed. Call your doctor or health care professional if you notice any unusual bleeding. Be careful brushing and flossing your teeth or using a toothpick because you may get an infection or bleed more easily. If you have any dental work done, tell your dentist you are receiving this medicine. Avoid taking products that contain aspirin, acetaminophen, ibuprofen, naproxen, or ketoprofen unless instructed by your doctor. These medicines may hide a fever. Do not become pregnant while  taking this medicine. Women should inform their doctor if they wish to become pregnant or think they might be pregnant. There is a potential for serious side effects to an unborn child. Talk  to your health care professional or pharmacist for more information. Do not breast-feed an infant while taking this medicine. Drink fluids as directed while you are taking this medicine. This will help protect your kidneys. Call your doctor or health care professional if you get diarrhea. Do not treat yourself. What side effects may I notice from receiving this medicine? Side effects that you should report to your doctor or health care professional as soon as possible:  allergic reactions like skin rash, itching or hives, swelling of the face, lips, or tongue  signs of infection - fever or chills, cough, sore throat, pain or difficulty passing urine  signs of decreased platelets or bleeding - bruising, pinpoint red spots on the skin, black, tarry stools, nosebleeds  signs of decreased red blood cells - unusually weak or tired, fainting spells, lightheadedness  breathing problems  changes in hearing  gout pain  low blood counts - This drug may decrease the number of white blood cells, red blood cells and platelets. You may be at increased risk for infections and bleeding.  nausea and vomiting  pain, swelling, redness or irritation at the injection site  pain, tingling, numbness in the hands or feet  problems with balance, movement  trouble passing urine or change in the amount of urine Side effects that usually do not require medical attention (report to your doctor or health care professional if they continue or are bothersome):  changes in vision  loss of appetite  metallic taste in the mouth or changes in taste This list may not describe all possible side effects. Call your doctor for medical advice about side effects. You may report side effects to FDA at 1-800-FDA-1088. Where should I keep my medicine? This drug is given in a hospital or clinic and will not be stored at home. NOTE: This sheet is a summary. It may not cover all possible information. If you have questions  about this medicine, talk to your doctor, pharmacist, or health care provider.  2020 [employer] Standard (2008-03-02 14:40:54)  

## 2019-10-15 ENCOUNTER — Inpatient Hospital Stay: Payer: Medicare Other | Attending: Oncology

## 2019-10-15 ENCOUNTER — Inpatient Hospital Stay (HOSPITAL_BASED_OUTPATIENT_CLINIC_OR_DEPARTMENT_OTHER): Payer: Medicare Other | Admitting: Oncology

## 2019-10-15 ENCOUNTER — Encounter: Payer: Self-pay | Admitting: Oncology

## 2019-10-15 ENCOUNTER — Other Ambulatory Visit: Payer: Self-pay

## 2019-10-15 DIAGNOSIS — Z87891 Personal history of nicotine dependence: Secondary | ICD-10-CM | POA: Diagnosis not present

## 2019-10-15 DIAGNOSIS — Z5111 Encounter for antineoplastic chemotherapy: Secondary | ICD-10-CM | POA: Insufficient documentation

## 2019-10-15 DIAGNOSIS — K5900 Constipation, unspecified: Secondary | ICD-10-CM | POA: Insufficient documentation

## 2019-10-15 DIAGNOSIS — Z79899 Other long term (current) drug therapy: Secondary | ICD-10-CM | POA: Insufficient documentation

## 2019-10-15 DIAGNOSIS — R110 Nausea: Secondary | ICD-10-CM | POA: Insufficient documentation

## 2019-10-15 DIAGNOSIS — E860 Dehydration: Secondary | ICD-10-CM | POA: Insufficient documentation

## 2019-10-15 DIAGNOSIS — C099 Malignant neoplasm of tonsil, unspecified: Secondary | ICD-10-CM | POA: Insufficient documentation

## 2019-10-15 NOTE — Progress Notes (Signed)
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  Chief complaint/Reason for visit- Initial Meeting for [facility], preparing for starting chemotherapy  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[place])  10/01/2019 Initial Diagnosis   Right tonsillar squamous cell carcinoma ([place])   10/21/2019 -  Chemotherapy   The patient had palonosetron (ALOXI) injection 0.25 mg, 0.25 mg, Intravenous,  Once, 0 of 7 cycles CISplatin (PLATINOL) 97 mg in sodium chloride 0.9 % 250 mL chemo infusion, 40 mg/m2 = 97 mg, Intravenous,  Once, 0 of 7 cycles fosaprepitant (EMEND) 150 mg, dexamethasone (DECADRON) 12 mg in sodium chloride 0.9 % 145 mL IVPB, , Intravenous,  Once, 0 of 7 cycles  for chemotherapy treatment.      Interval history-  Mr. Florea is a 63 year old male who presents to chemo care clinic today for initial meeting in preparation for starting chemotherapy. I introduced the chemo care clinic and we discussed that the role of the clinic is to assist those who are at an increased risk of emergency room visits and/or complications during the course of chemotherapy treatment. We discussed that the increased risk takes into account factors such as age, performance status, and co-morbidities. We also discussed that for some, this might include barriers to care such as not having a primary care provider, lack of insurance/transportation, or not being able to afford medications. We discussed that the goal of the program is to help prevent unplanned ER visits and help reduce complications during chemotherapy. We do this by discussing specific risk factors to each individual and  identifying ways that we can help improve these risk factors and reduce barriers to care.   ECOG FS:0 - Asymptomatic  Review of systems- Review of Systems  Constitutional: Negative.  Negative for chills, fever, malaise/fatigue and weight loss.  HENT: Negative for congestion, ear pain and tinnitus.   Eyes: Negative.  Negative for blurred vision and double vision.  Respiratory: Negative.  Negative for cough, sputum production and shortness of breath.   Cardiovascular: Negative.  Negative for chest pain, palpitations and leg swelling.  Gastrointestinal: Negative.  Negative for abdominal pain, constipation, diarrhea, nausea and vomiting.  Genitourinary: Negative for dysuria, frequency and urgency.  Musculoskeletal: Negative for back pain and falls.  Skin: Negative.  Negative for rash.  Neurological: Negative.  Negative for weakness and headaches.  Endo/Heme/Allergies: Negative.  Does not bruise/bleed easily.  Psychiatric/Behavioral: Negative.  Negative for depression. The patient is not nervous/anxious and does not have insomnia.     Current treatment-concurrent cisplatin and radiation.  Will begin on 10/21/2019.  No Known Allergies  No past medical history on file.  Past Surgical History:  Procedure Laterality Date   ADENOIDECTOMY     INGUINAL HERNIA REPAIR Left    INGUINAL LYMPH NODE BIOPSY Right    removal of lymph node   TONSILLECTOMY      Social History   Socioeconomic History   Marital status: Married    Spouse name: Not on file   Number of children: Not on file   Years of education: Not on file   Highest education level: Not on file  Occupational History   Not on file  Social Designer, fashion/clothing strain: Not on file   Food insecurity    Worry: Not on file    Inability: Not on file   Transportation needs    Medical: Not on file    Non-medical: Not on file  Tobacco Use   Smoking status: Never Smoker   Smokeless tobacco: Never Used  Substance  and Sexual Activity   Alcohol use: Yes   Drug use: Not Currently   Sexual activity: Not on file  Lifestyle   Physical activity    Days per week: Not on file    Minutes per session: Not on file   Stress: Not on file  Relationships   Social connections    Talks on phone: Not on file    Gets together: Not on file    Attends religious service: Not on file    Active member of club or organization: Not on file    Attends meetings of clubs or organizations: Not on file    Relationship status: Not on file   Intimate partner violence    Fear of current or ex partner: Not on file    Emotionally abused: Not on file    Physically abused: Not on file    Forced sexual activity: Not on file  Other Topics Concern   Not on file  Social History Narrative   Not on file    Family History  Problem Relation Age of Onset   Multiple myeloma Brother      Current Outpatient Medications:    chlorpheniramine (CHLOR-TRIMETON) 2 MG/5ML syrup, Take 2 mg by mouth every 4 (four) hours as needed for allergies., Disp: , Rfl:    diclofenac sodium (VOLTAREN) 1 % GEL, Voltaren 1 % topical gel  APPLY 2 GRAM TO THE AFFECTED AREA(S) BY TOPICAL ROUTE 4 TIMES PER DAY, Disp: , Rfl:    ondansetron (ZOFRAN) 8 MG tablet, Take 1 tablet (8 mg total) by mouth 2 (two) times daily as needed., Disp: 60 tablet, Rfl: 1   prochlorperazine (COMPAZINE) 10 MG tablet, Take 1 tablet (10 mg total) by mouth every 6 (six) hours as needed (Nausea or vomiting)., Disp: 60 tablet, Rfl: 1   SILDENAFIL CITRATE PO, CHEW 1 TO 2 TABLETS BY MOUTH 30 TO 45 MINUTES BEFORE SEXUAL ACTIVITY AS NEEDED, Disp: , Rfl:   Physical exam: There were no vitals filed for this visit. Physical Exam Constitutional:      Appearance: Normal appearance.  HENT:     Head: Normocephalic and atraumatic.  Eyes:     Pupils: Pupils are equal, round, and reactive to light.  Neck:     Musculoskeletal: Normal range of motion.  Cardiovascular:     Rate  and Rhythm: Normal rate and regular rhythm.     Heart sounds: Normal heart sounds. No murmur.  Pulmonary:     Effort: Pulmonary effort is normal.     Breath sounds: Normal breath sounds. No wheezing.  Abdominal:     General: Bowel sounds are normal. There is no distension.     Palpations: Abdomen is soft.     Tenderness: There is no abdominal tenderness.  Musculoskeletal: Normal range of motion.  Skin:    General: Skin is warm and dry.     Findings: No rash.  Neurological:     Mental Status: He is alert and oriented to person, place, and time.  Psychiatric:        Judgment: Judgment normal.      CMP Latest  Ref Rng & Units 10/02/2019  Glucose 70 - 99 mg/dL 101(H)  BUN 8 - 23 mg/dL 12  Creatinine 0.61 - 1.24 mg/dL 0.90  Sodium 135 - 145 mmol/L 140  Potassium 3.5 - 5.1 mmol/L 4.5  Chloride 98 - 111 mmol/L 107  CO2 22 - 32 mmol/L 27  Calcium 8.9 - 10.3 mg/dL 9.2  Total Protein 6.5 - 8.1 g/dL 7.1  Total Bilirubin 0.3 - 1.2 mg/dL 1.0  Alkaline Phos 38 - 126 U/L 58  AST 15 - 41 U/L 22  ALT 0 - 44 U/L 21   CBC Latest Ref Rng & Units 10/02/2019  WBC 4.0 - 10.5 K/uL 6.0  Hemoglobin 13.0 - 17.0 g/dL 15.9  Hematocrit 39.0 - 52.0 % 45.7  Platelets 150 - 400 K/uL 161    No images are attached to the encounter.  Ct Soft Tissue Neck W Contrast  Result Date: 09/16/2019 CLINICAL DATA:  Right submandibular mass for 1 month EXAM: CT NECK WITH CONTRAST TECHNIQUE: Multidetector CT imaging of the neck was performed using the standard protocol following the bolus administration of intravenous contrast. CONTRAST:  22m OMNIPAQUE IOHEXOL 300 MG/ML  SOLN COMPARISON:  None. FINDINGS: Pharynx and larynx: 2.5 cm enhancing mass at the right glossotonsillar sulcus with submucosal extension into the tongue musculature. No bony or carotid space invasion. Salivary glands: Negative for mass or inflammation. Thyroid: Subcentimeter right thyroid nodule, incidental Lymph nodes: 4 enlarged and rounded right  upper jugular chain lymph nodes measuring up to 17 mm in diameter. No contralateral adenopathy is seen. Vascular: Negative Limited intracranial: Negative Visualized orbits: Negative Mastoids and visualized paranasal sinuses: Polypoid densities in the bilateral nasal cavity. There has been endoscopic sinus surgery previously. Skeleton: No acute or aggressive finding. Cervical degenerative disc narrowing. Upper chest: Negative IMPRESSION: 2.5 cm mass at the right glossotonsillar sulcus consistent with carcinoma. There are multiple ipsilateral lymph nodes in the upper jugular chain measuring up to 17 mm. Prior endoscopic sinus surgery.  There are bilateral nasal polyps. Electronically Signed   By: JMonte FantasiaM.D.   On: 09/16/2019 08:47   Nm Pet Image Initial (pi) Skull Base To Thigh  Result Date: 10/07/2019 CLINICAL DATA:  Initial treatment strategy for head and neck cancer. EXAM: NUCLEAR MEDICINE PET SKULL BASE TO THIGH TECHNIQUE: 12.4 mCi F-18 FDG was injected intravenously. Full-ring PET imaging was performed from the skull base to thigh after the radiotracer. CT data was obtained and used for attenuation correction and anatomic localization. Fasting blood glucose: 100 mg/dl COMPARISON:  CT neck 09/15/2019 FINDINGS: Mediastinal blood pool activity: SUV max 2.68 Liver activity: SUV max NA NECK: Asymmetric increased uptake localizing to the right glossotonsillar sulcus mass has an SUV max of 12.24. Multiple FDG avid right cervical lymph nodes identified. Index right level 2 node measures 2 cm and has an SUV max of 6.8. No FDG avid left cervical lymph nodes. Incidental CT findings: none CHEST: No hypermetabolic mediastinal or hilar nodes. No suspicious pulmonary nodules on the CT scan. Incidental CT findings: Dependent changes and subsegmental atelectasis noted in the posterior lung bases. Lad coronary artery calcifications. Aortic atherosclerosis. ABDOMEN/PELVIS: No abnormal hypermetabolic activity within  the liver, pancreas, adrenal glands, or spleen. No hypermetabolic lymph nodes in the abdomen or pelvis. Incidental CT findings: Fat containing umbilical hernia. Left kidney stones. Aortic atherosclerosis. SKELETON: No focal hypermetabolic activity to suggest skeletal metastasis. Incidental CT findings: none IMPRESSION: 1. There is marked increased radiotracer uptake localizing to the right glossotonsillar sulcus mass identified  on recent CT. SUV max is equal to 12.24. 2. Multiple right cervical lymph nodes are FDG avid compatible with metastatic adenopathy. No FDG avid left cervical lymph nodes or distant metastatic disease. 3. Left kidney stones. 4. Lad coronary artery atherosclerotic calcifications. Electronically Signed   By: Kerby Moors M.D.   On: 10/07/2019 11:40   [place] Core Biopsy (lymph Nodes)  Result Date: 09/30/2019 INDICATION: 63 year old with a mass at the right glossotonsillar sulcus and right cervical lymphadenopathy. Tissue diagnosis is needed. EXAM: ULTRASOUND-GUIDED RIGHT CERVICAL LYMPH NODE BIOPSY MEDICATIONS: None. ANESTHESIA/SEDATION: None FLUOROSCOPY TIME:  None COMPLICATIONS: None immediate. PROCEDURE: Informed written consent was obtained from the patient after a thorough discussion of the procedural risks, benefits and alternatives. All questions were addressed. A timeout was performed prior to the initiation of the procedure. Ultrasound was used to evaluate the right side of the neck. Several abnormal hypoechoic lymph nodes were identified. Lymph node superficial to the right internal jugular vein and right carotid artery was targeted. Skin was prepped with chlorhexidine and sterile field was created. Skin was anesthetized with 1% lidocaine. Using ultrasound guidance, core biopsies were obtained from the targeted lymph node with an 18 gauge device. Specimens placed on Telfa pad with saline. Bandage placed over the puncture site. FINDINGS: Several rounded hypoechoic lymph nodes in the  right neck. Six core biopsies obtained and no immediate bleeding or hematoma formation. IMPRESSION: Ultrasound-guided core biopsies of a right cervical lymph node. Electronically Signed   By: Markus Daft M.D.   On: 09/30/2019 11:10    Assessment and plan- Patient is a 63 y.o. male who presents to [facility] for initial meeting in preparation for starting chemotherapy for the treatment of tonsil cancer.    1. Cancer-stage IV a squamous cell carcinoma of the right tonsil.  He initially presented to PCP with persistent sore throat and increased cervical lymphadenopathy.  Subsequent CT scan and biopsy confirmed metastatic squamous cell carcinoma.  Biopsy performed and confirmed stage of disease.  Plan is for him to be treated with weekly cisplatin and concurrent XRT.  PET scan revealed cervical lymphadenopathy and right tonsillar mass.   2. Chemo Care Clinic/High Risk for ER/Hospitalization during chemotherapy- We discussed the role of the chemo care clinic and identified patient specific risk factors. I discussed that patient was identified as high based on stage of disease. He is married and lives at home with his wife. He has a PCP Dr. Hall Busing whom he see's regularly.   His pmh is positive for: No past medical history on file.  His psh is positive for: Past Surgical History:  Procedure Laterality Date   ADENOIDECTOMY     INGUINAL HERNIA REPAIR Left    INGUINAL LYMPH NODE BIOPSY Right    removal of lymph node   TONSILLECTOMY     3. Social Determinants of Health- we discussed that social determinants of health may have significant impacts on health and outcomes for cancer patients.  Today we discussed specific social determinants of performance status, alcohol use, depression, financial needs, food insecurity, housing, interpersonal violence, social connections, stress, tobacco use, and transportation.  After lengthy discussion we did not identify any areas of need at this time.  We  discussed self-referral to [facility] for counseling services, psychiatry for medication management, or palliative care/symptom management as well as primary care providers.  We discussed that living with cancer can create tremendous financial burden.  We discussed options for assistance. I asked that if assistance is needed  in affording medications or paying bills to please let us know so that we can provide assistance. We discussed options for food including social services.  We will also notify [facility] to see if cancer center can provide support.  Patient informed of food pantry at cancer center and was provided with care package today.  Please notify nursing if un-met needs. We discussed referral to social work. Will also discuss with Elease Etienne to see if [place] can provide support of utility bills, etc. We discussed options for support groups at the cancer center. If interested, please notify nurse navigator to enroll. We discussed options for managing stress including healthy eating, exercise as well as participating in no charge counseling services at the cancer center and support groups.  If these are of interest, patient can notify either myself or primary nursing team. We discussed options for transportation including [employer], paratransit, bus routes, [employer] transit, taxi/uber/lyft, and cancer center van.  I have notified primary oncology team who will help assist with arranging Lucianne Lei transportation for appointments when needed. We also discussed options for transportation on short notice/acute visits.   4. Co-morbidities Complicating Care:  No past medical history on file.  5. Palliative Care- based on stage of cancer and/or identified needs today, I will refer patient to palliative care for goals of care and advanced care planning.  6.  Patient has questions regarding shingles vaccine.  Had shingles about 25 years ago and he is interested in receiving the vaccine prior to initiating  chemotherapy.  Spoke with Dr. Grayland Ormond who is okay with receiving the vaccine.  Relayed to patient.  We also discussed the role of the Symptom Management Clinic at [facility] for acute issues and methods of contacting clinic/provider. He denies needing specific assistance at this time and He will be followed by Dr. Gary Fleet, [place] (Nurse Navigator).   Visit Diagnosis 1. Personal history of tobacco use, presenting hazards to health     Patient expressed understanding and was in agreement with this plan. He also understands that He can call clinic at any time with any questions, concerns, or complaints.   A total of (25) minutes of face-to-face time was spent with this patient with greater than 50% of that time in counseling and care-coordination.  Rulon Abide, NP, AGNP-C [place] at [facility] [phone] (office)  CC: Dr. Grayland Ormond

## 2019-10-16 NOTE — Progress Notes (Signed)
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  INTERVAL HISTORY: Patient returns to clinic today for further evaluation and consideration of cycle one of weekly cisplatin.  He is anxious, but otherwise feels well. He has no neurologic complaints.  He denies any recent fevers or illnesses.  He has a good appetite and denies weight loss.  He has no chest pain, shortness of breath, cough, or hemoptysis.  He has no nausea, vomiting, constipation, or diarrhea.  He has no urinary complaints.  Patient offers no further specific complaints today.    REVIEW OF SYSTEMS:   Review of Systems  Constitutional: Negative.  Negative for fever, malaise/fatigue and weight loss.  Respiratory: Negative.  Negative for cough, hemoptysis and shortness of breath.   Cardiovascular: Negative.  Negative for chest pain and leg swelling.  Gastrointestinal: Negative.  Negative for abdominal pain.  Genitourinary: Negative.  Negative for dysuria.  Musculoskeletal: Negative.  Negative for back pain.  Skin: Negative.  Negative for rash.  Neurological: Negative.  Negative for dizziness, focal weakness, weakness and headaches.  Psychiatric/Behavioral: The patient is nervous/anxious.     As per HPI. Otherwise, a complete review of systems is negative.  PAST MEDICAL HISTORY: No past medical history on file.  PAST SURGICAL HISTORY: Past Surgical History:  Procedure Laterality Date   ADENOIDECTOMY     INGUINAL HERNIA REPAIR Left    INGUINAL LYMPH NODE BIOPSY Right    removal of lymph node   TONSILLECTOMY      FAMILY HISTORY: Family History  Problem Relation Age of Onset   Multiple myeloma Brother     ADVANCED DIRECTIVES (Y/N):  N  HEALTH MAINTENANCE: Social  History   Tobacco Use   Smoking status: Never Smoker   Smokeless tobacco: Never Used  Substance Use Topics   Alcohol use: Yes   Drug use: Not Currently     Colonoscopy:  PAP:  Bone density:  Lipid panel:  No Known Allergies  Current Outpatient Medications  Medication Sig Dispense Refill   chlorpheniramine (CHLOR-TRIMETON) 2 MG/5ML syrup Take 2 mg by mouth every 4 (four) hours as needed for allergies.     diclofenac sodium (VOLTAREN) 1 % GEL Voltaren 1 % topical gel  APPLY 2 GRAM TO THE AFFECTED AREA(S) BY TOPICAL ROUTE 4 TIMES PER DAY     ondansetron (ZOFRAN) 8 MG tablet Take 1 tablet (8 mg total) by mouth 2 (two) times daily as needed. 60 tablet 1   prochlorperazine (COMPAZINE) 10 MG tablet Take 1 tablet (10 mg total) by mouth every 6 (six) hours as needed (Nausea or vomiting). 60 tablet 1   SILDENAFIL CITRATE PO CHEW 1 TO 2 TABLETS BY MOUTH 30 TO 45 MINUTES BEFORE SEXUAL ACTIVITY AS NEEDED     No current facility-administered medications for this visit.     OBJECTIVE: Vitals:   10/21/19 0829  BP: 118/61  Pulse: 68  Temp: 97.9 F (36.6 C)  SpO2: 97%     Body mass index is 31.79 kg/m.    ECOG FS:0 - Asymptomatic  General: Well-developed, well-nourished, no acute distress. Eyes: Pink conjunctiva, anicteric sclera. HEENT: Normocephalic, moist mucous membranes. Lungs: Clear to auscultation bilaterally. Heart: Regular rate and rhythm. No rubs, murmurs, or gallops. Abdomen: Soft, nontender, nondistended. No organomegaly noted,  normoactive bowel sounds. Musculoskeletal: No edema, cyanosis, or clubbing. Neuro: Alert, answering all questions appropriately. Cranial nerves grossly intact. Skin: No rashes or petechiae noted. Psych: Normal affect.  LAB RESULTS:  Lab Results  Component Value Date   NA 139 10/21/2019   K 4.0 10/21/2019   CL 107 10/21/2019   CO2 27 10/21/2019   GLUCOSE 80 10/21/2019   BUN 13 10/21/2019   CREATININE 0.93 10/21/2019   CALCIUM 8.5  (L) 10/21/2019   PROT 7.1 10/02/2019   ALBUMIN 4.3 10/02/2019   AST 22 10/02/2019   ALT 21 10/02/2019   ALKPHOS 58 10/02/2019   BILITOT 1.0 10/02/2019   GFRNONAA >60 10/21/2019   GFRAA >60 10/21/2019    Lab Results  Component Value Date   WBC 6.3 10/21/2019   NEUTROABS 4.1 10/21/2019   HGB 15.4 10/21/2019   HCT 45.4 10/21/2019   MCV 90.6 10/21/2019   PLT 171 10/21/2019     STUDIES: Nm Pet Image Initial (pi) Skull Base To Thigh  Result Date: 10/07/2019 CLINICAL DATA:  Initial treatment strategy for head and neck cancer. EXAM: NUCLEAR MEDICINE PET SKULL BASE TO THIGH TECHNIQUE: 12.4 mCi F-18 FDG was injected intravenously. Full-ring PET imaging was performed from the skull base to thigh after the radiotracer. CT data was obtained and used for attenuation correction and anatomic localization. Fasting blood glucose: 100 mg/dl COMPARISON:  CT neck 09/15/2019 FINDINGS: Mediastinal blood pool activity: SUV max 2.68 Liver activity: SUV max NA NECK: Asymmetric increased uptake localizing to the right glossotonsillar sulcus mass has an SUV max of 12.24. Multiple FDG avid right cervical lymph nodes identified. Index right level 2 node measures 2 cm and has an SUV max of 6.8. No FDG avid left cervical lymph nodes. Incidental CT findings: none CHEST: No hypermetabolic mediastinal or hilar nodes. No suspicious pulmonary nodules on the CT scan. Incidental CT findings: Dependent changes and subsegmental atelectasis noted in the posterior lung bases. Lad coronary artery calcifications. Aortic atherosclerosis. ABDOMEN/PELVIS: No abnormal hypermetabolic activity within the liver, pancreas, adrenal glands, or spleen. No hypermetabolic lymph nodes in the abdomen or pelvis. Incidental CT findings: Fat containing umbilical hernia. Left kidney stones. Aortic atherosclerosis. SKELETON: No focal hypermetabolic activity to suggest skeletal metastasis. Incidental CT findings: none IMPRESSION: 1. There is marked  increased radiotracer uptake localizing to the right glossotonsillar sulcus mass identified on recent CT. SUV max is equal to 12.24. 2. Multiple right cervical lymph nodes are FDG avid compatible with metastatic adenopathy. No FDG avid left cervical lymph nodes or distant metastatic disease. 3. Left kidney stones. 4. Lad coronary artery atherosclerotic calcifications. Electronically Signed   By: Kerby Moors M.D.   On: 10/07/2019 11:40   [place] Core Biopsy (lymph Nodes)  Result Date: 09/30/2019 INDICATION: 63 year old with a mass at the right glossotonsillar sulcus and right cervical lymphadenopathy. Tissue diagnosis is needed. EXAM: ULTRASOUND-GUIDED RIGHT CERVICAL LYMPH NODE BIOPSY MEDICATIONS: None. ANESTHESIA/SEDATION: None FLUOROSCOPY TIME:  None COMPLICATIONS: None immediate. PROCEDURE: Informed written consent was obtained from the patient after a thorough discussion of the procedural risks, benefits and alternatives. All questions were addressed. A timeout was performed prior to the initiation of the procedure. Ultrasound was used to evaluate the right side of the neck. Several abnormal hypoechoic lymph nodes were identified. Lymph node superficial to the right internal jugular vein and right carotid artery was targeted. Skin was prepped with chlorhexidine and sterile field was created. Skin was anesthetized with 1% lidocaine. Using ultrasound guidance, core biopsies were obtained from the  targeted lymph node with an 18 gauge device. Specimens placed on Telfa pad with saline. Bandage placed over the puncture site. FINDINGS: Several rounded hypoechoic lymph nodes in the right neck. Six core biopsies obtained and no immediate bleeding or hematoma formation. IMPRESSION: Ultrasound-guided core biopsies of a right cervical lymph node. Electronically Signed   By: Markus Daft M.D.   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  PLAN:    1. Stage IVa squamous  cell carcinoma of the right tonsil: PET scan results from October 07, 2019 reviewed independently and reported as above confirming stage of disease.  Patient has no obvious metastatic disease.  Plan is to give weekly cisplatin throughout the duration of XRT which initiates tomorrow.  Proceed with cycle 1 of weekly cisplatin.  Return to clinic in 1 week for further evaluation and consideration of cycle 2.  I spent a total of 30 minutes face-to-face with the patient of which greater than 50% of the visit was spent in counseling and coordination of care as detailed above.   Patient expressed understanding and was in agreement with this plan. He also understands that He can call clinic at any time with any questions, concerns, or complaints.    Lloyd Huger, MD   10/21/2019 3:09 PM

## 2019-10-19 ENCOUNTER — Telehealth: Payer: Self-pay

## 2019-10-19 NOTE — Telephone Encounter (Addendum)
Nutrition Assessment   Reason for Assessment:  Referral from [place], RN, head and neck cancer patient   ASSESSMENT:  63 year old male with stage IV squamous cell carcinoma of the right tonsil cancer.  No past medical history.  Patient planning to start radiation and chemotherapy on 11/11.    Spoke with patient via phone this pm.  Patient reports normal appetite, eating all consistencies of foods without difficulty.      Nutrition Focused Physical Exam: deferred   Medications: zofran, compazine   Labs: glucose 101   Anthropometrics:   Height: 74 inches Weight: 248 lb on 10/29 Stable weight BMI: 31  Patient verbalized that he wants to loose weight   Estimated Energy Needs  Kcals: 2400-2800 Protein: 120-140 g Fluid: > 2.4 L   NUTRITION DIAGNOSIS: Predicted suboptimal energy intake related to tonsil cancer as evidenced by planning treatment side effects   INTERVENTION:  Reviewed RD role in treatment process.  Encouraged weight maintenance during treatment to maintain lean muscle mass Recommend screen by OT for head and neck cancer beginning concurrent radiation and chemotherapy treatment.  RD to discuss more at next follow-up   MONITORING, EVALUATION, GOAL: Patient will consume adequate calories and protein to maintain lean muscle mass   Next Visit: Thursday, Nov 19th  [facility] B. Zenia Resides, [place], [place] Registered Dietitian [phone] (pager)

## 2019-10-20 ENCOUNTER — Other Ambulatory Visit: Payer: Self-pay

## 2019-10-20 NOTE — Progress Notes (Signed)
Patient pre screened for office appointment, no questions or concerns today. 

## 2019-10-21 ENCOUNTER — Other Ambulatory Visit: Payer: Self-pay

## 2019-10-21 ENCOUNTER — Inpatient Hospital Stay (HOSPITAL_BASED_OUTPATIENT_CLINIC_OR_DEPARTMENT_OTHER): Payer: Medicare Other | Admitting: Oncology

## 2019-10-21 ENCOUNTER — Ambulatory Visit
Admission: RE | Admit: 2019-10-21 | Discharge: 2019-10-21 | Disposition: A | Discharge status: Home / Self Care | Payer: Medicare Other | Source: Ambulatory Visit | Attending: Radiation Oncology | Admitting: Radiation Oncology

## 2019-10-21 ENCOUNTER — Inpatient Hospital Stay: Payer: Medicare Other

## 2019-10-21 VITALS — BP 120/65 | HR 55

## 2019-10-21 VITALS — BP 118/61 | HR 68 | Temp 97.9°F | Wt 247.6 lb

## 2019-10-21 DIAGNOSIS — C099 Malignant neoplasm of tonsil, unspecified: Secondary | ICD-10-CM

## 2019-10-21 DIAGNOSIS — E860 Dehydration: Secondary | ICD-10-CM | POA: Diagnosis not present

## 2019-10-21 DIAGNOSIS — Z5111 Encounter for antineoplastic chemotherapy: Secondary | ICD-10-CM | POA: Diagnosis not present

## 2019-10-21 DIAGNOSIS — Z79899 Other long term (current) drug therapy: Secondary | ICD-10-CM | POA: Diagnosis not present

## 2019-10-21 DIAGNOSIS — K5900 Constipation, unspecified: Secondary | ICD-10-CM | POA: Diagnosis not present

## 2019-10-21 DIAGNOSIS — R110 Nausea: Secondary | ICD-10-CM | POA: Diagnosis not present

## 2019-10-21 LAB — CBC WITH DIFFERENTIAL/PLATELET
Abs Immature Granulocytes: 0.02 10*3/uL (ref 0.00–0.07)
Basophils Absolute: 0 10*3/uL (ref 0.0–0.1)
Basophils Relative: 1 %
Eosinophils Absolute: 0.2 10*3/uL (ref 0.0–0.5)
Eosinophils Relative: 3 %
HCT: 45.4 % (ref 39.0–52.0)
Hemoglobin: 15.4 g/dL (ref 13.0–17.0)
Immature Granulocytes: 0 %
Lymphocytes Relative: 21 %
Lymphs Abs: 1.4 10*3/uL (ref 0.7–4.0)
MCH: 30.7 pg (ref 26.0–34.0)
MCHC: 33.9 g/dL (ref 30.0–36.0)
MCV: 90.6 fL (ref 80.0–100.0)
Monocytes Absolute: 0.6 10*3/uL (ref 0.1–1.0)
Monocytes Relative: 10 %
Neutro Abs: 4.1 10*3/uL (ref 1.7–7.7)
Neutrophils Relative %: 65 %
Platelets: 171 10*3/uL (ref 150–400)
RBC: 5.01 MIL/uL (ref 4.22–5.81)
RDW: 13 % (ref 11.5–15.5)
WBC: 6.3 10*3/uL (ref 4.0–10.5)
nRBC: 0 % (ref 0.0–0.2)

## 2019-10-21 LAB — BASIC METABOLIC PANEL
Anion gap: 5 (ref 5–15)
BUN: 13 mg/dL (ref 8–23)
CO2: 27 mmol/L (ref 22–32)
Calcium: 8.5 mg/dL — ABNORMAL LOW (ref 8.9–10.3)
Chloride: 107 mmol/L (ref 98–111)
Creatinine, Ser: 0.93 mg/dL (ref 0.61–1.24)
GFR calc Af Amer: >60 mL/min (ref >60)
GFR calc non Af Amer: >60 mL/min (ref >60)
Glucose, Bld: 80 mg/dL (ref 70–99)
Potassium: 4 mmol/L (ref 3.5–5.1)
Sodium: 139 mmol/L (ref 135–145)

## 2019-10-21 MED ORDER — POTASSIUM CHLORIDE 2 MEQ/ML IV SOLN
Freq: Once | INTRAVENOUS | Status: AC
  Administered 2019-10-21: 10:00:00 via INTRAVENOUS
  Filled 2019-10-21: qty 1000

## 2019-10-21 MED ORDER — PALONOSETRON HCL INJECTION 0.25 MG/5ML
0.2500 mg | Freq: Once | INTRAVENOUS | Status: AC
  Administered 2019-10-21: 0.25 mg via INTRAVENOUS
  Filled 2019-10-21: qty 5

## 2019-10-21 MED ORDER — SODIUM CHLORIDE 0.9 % IV SOLN
40.0000 mg/m2 | Freq: Once | INTRAVENOUS | Status: AC
  Administered 2019-10-21: 97 mg via INTRAVENOUS
  Filled 2019-10-21: qty 97

## 2019-10-21 MED ORDER — SODIUM CHLORIDE 0.9 % IV SOLN
Freq: Once | INTRAVENOUS | Status: AC
  Administered 2019-10-21: 09:00:00 via INTRAVENOUS
  Filled 2019-10-21: qty 250

## 2019-10-21 MED ORDER — SODIUM CHLORIDE 0.9 % IV SOLN
Freq: Once | INTRAVENOUS | Status: AC
  Administered 2019-10-21: 12:00:00 via INTRAVENOUS
  Filled 2019-10-21: qty 5

## 2019-10-21 NOTE — Progress Notes (Signed)
Pt tolerated infusion well. Pt denies any concerns or complaints during treatment or at discharge. Pt educated to call clinic for any concerns, or report to ER or call 911 in the case of an emgerency. Pt and VS stable at discharge.

## 2019-10-22 ENCOUNTER — Telehealth: Payer: Self-pay

## 2019-10-22 ENCOUNTER — Ambulatory Visit
Admission: RE | Admit: 2019-10-22 | Discharge: 2019-10-22 | Disposition: A | Discharge status: Home / Self Care | Payer: Medicare Other | Source: Ambulatory Visit | Attending: Radiation Oncology | Admitting: Radiation Oncology

## 2019-10-22 ENCOUNTER — Other Ambulatory Visit: Payer: Self-pay

## 2019-10-22 DIAGNOSIS — Z510 Encounter for antineoplastic radiation therapy: Secondary | ICD-10-CM | POA: Diagnosis not present

## 2019-10-22 NOTE — Telephone Encounter (Signed)
Telephone call to patient for follow up after receiving first infusion yesterday.  Patient states treatment went well.  States did experience some nausea this morning but took anti-nausea meds and it went away quickly.  States is eating well and drinking plenty of fluids.  Encouraged patient to call for any questions or concerns.

## 2019-10-23 ENCOUNTER — Ambulatory Visit
Admission: RE | Admit: 2019-10-23 | Discharge: 2019-10-23 | Disposition: A | Discharge status: Home / Self Care | Payer: Medicare Other | Source: Ambulatory Visit | Attending: Radiation Oncology | Admitting: Radiation Oncology

## 2019-10-23 ENCOUNTER — Other Ambulatory Visit: Payer: Self-pay

## 2019-10-23 DIAGNOSIS — Z510 Encounter for antineoplastic radiation therapy: Secondary | ICD-10-CM | POA: Diagnosis not present

## 2019-10-23 NOTE — Progress Notes (Signed)
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  INTERVAL HISTORY: Patient returns to clinic today for further evaluation and consideration of cycle 2 of weekly cisplatin.  He had some increased nausea and urinary retention over the weekend, but otherwise tolerating his treatments well.  He now feels back to his baseline.  He has no neurologic complaints.  He denies any recent fevers or illnesses.  He has a good appetite and denies weight loss.  He has no chest pain, shortness of breath, cough, or hemoptysis.  He has no nausea, vomiting, constipation, or diarrhea.  He has no urinary complaints.  Patient offers no further specific complaints today.  REVIEW OF SYSTEMS:   Review of Systems  Constitutional: Negative.  Negative for fever, malaise/fatigue and weight loss.  Respiratory: Negative.  Negative for cough, hemoptysis and shortness of breath.   Cardiovascular: Negative.  Negative for chest pain and leg swelling.  Gastrointestinal: Negative.  Negative for abdominal pain.  Genitourinary: Negative.  Negative for dysuria.  Musculoskeletal: Negative.  Negative for back pain.  Skin: Negative.  Negative for rash.  Neurological: Negative.  Negative for dizziness, focal weakness, weakness and headaches.  Psychiatric/Behavioral: Negative.  The patient is not nervous/anxious.     As per HPI. Otherwise, a complete review of systems is negative.  PAST MEDICAL HISTORY: No past medical history on file.  PAST SURGICAL HISTORY: Past Surgical History:  Procedure Laterality Date   ADENOIDECTOMY     INGUINAL HERNIA REPAIR Left    INGUINAL LYMPH NODE BIOPSY Right    removal of lymph node   TONSILLECTOMY      FAMILY HISTORY: Family History  Problem  Relation Age of Onset   Multiple myeloma Brother     ADVANCED DIRECTIVES (Y/N):  N  HEALTH MAINTENANCE: Social History   Tobacco Use   Smoking status: Never Smoker   Smokeless tobacco: Never Used  Substance Use Topics   Alcohol use: Yes   Drug use: Not Currently     Colonoscopy:  PAP:  Bone density:  Lipid panel:  No Known Allergies  Current Outpatient Medications  Medication Sig Dispense Refill   chlorpheniramine (CHLOR-TRIMETON) 2 MG/5ML syrup Take 2 mg by mouth every 4 (four) hours as needed for allergies.     diclofenac sodium (VOLTAREN) 1 % GEL Voltaren 1 % topical gel  APPLY 2 GRAM TO THE AFFECTED AREA(S) BY TOPICAL ROUTE 4 TIMES PER DAY     ondansetron (ZOFRAN) 8 MG tablet Take 1 tablet (8 mg total) by mouth 2 (two) times daily as needed. 60 tablet 1   prochlorperazine (COMPAZINE) 10 MG tablet Take 1 tablet (10 mg total) by mouth every 6 (six) hours as needed (Nausea or vomiting). 60 tablet 1   sennosides-docusate sodium (SENOKOT-S) 8.6-50 MG tablet Take 1 tablet by mouth daily.     SILDENAFIL CITRATE PO CHEW 1 TO 2 TABLETS BY MOUTH 30 TO 45 MINUTES BEFORE SEXUAL ACTIVITY AS NEEDED     No current facility-administered medications for this visit.    Facility-Administered Medications Ordered in Other Visits  Medication Dose Route Frequency Provider Last Rate Last Dose   CISplatin (PLATINOL) 97 mg in sodium chloride 0.9 % 250 mL chemo infusion  40 mg/m2 (Treatment Plan Recorded) Intravenous Once Bryan Huger,  MD       fosaprepitant (EMEND) 150 mg, dexamethasone (DECADRON) 12 mg in sodium chloride 0.9 % 145 mL IVPB   Intravenous Once Bryan Huger, MD       palonosetron (ALOXI) injection 0.25 mg  0.25 mg Intravenous Once Bryan Huger, MD        OBJECTIVE: Vitals:   10/28/19 0923  BP: 127/74  Pulse: 64  Resp: 18  Temp: 98.6 F (37 C)  SpO2: 100%     Body mass index is 30.79 kg/m.    ECOG FS:0 - Asymptomatic  General:  Well-developed, well-nourished, no acute distress. Eyes: Pink conjunctiva, anicteric sclera. HEENT: Normocephalic, moist mucous membranes. Lungs: Clear to auscultation bilaterally. Heart: Regular rate and rhythm. No rubs, murmurs, or gallops. Abdomen: Soft, nontender, nondistended. No organomegaly noted, normoactive bowel sounds. Musculoskeletal: No edema, cyanosis, or clubbing. Neuro: Alert, answering all questions appropriately. Cranial nerves grossly intact. Skin: No rashes or petechiae noted. Psych: Normal affect.  LAB RESULTS:  Lab Results  Component Value Date   NA 134 (L) 10/28/2019   K 4.2 10/28/2019   CL 101 10/28/2019   CO2 27 10/28/2019   GLUCOSE 107 (H) 10/28/2019   BUN 23 10/28/2019   CREATININE 1.14 10/28/2019   CALCIUM 8.9 10/28/2019   PROT 7.2 10/26/2019   ALBUMIN 4.1 10/26/2019   AST 18 10/26/2019   ALT 22 10/26/2019   ALKPHOS 53 10/26/2019   BILITOT 1.2 10/26/2019   GFRNONAA >60 10/28/2019   GFRAA >60 10/28/2019    Lab Results  Component Value Date   WBC 7.8 10/28/2019   NEUTROABS 5.7 10/28/2019   HGB 15.7 10/28/2019   HCT 45.7 10/28/2019   MCV 90.0 10/28/2019   PLT 167 10/28/2019     STUDIES: Nm Pet Image Initial (pi) Skull Base To Thigh  Result Date: 10/07/2019 CLINICAL DATA:  Initial treatment strategy for head and neck cancer. EXAM: NUCLEAR MEDICINE PET SKULL BASE TO THIGH TECHNIQUE: 12.4 mCi F-18 FDG was injected intravenously. Full-ring PET imaging was performed from the skull base to thigh after the radiotracer. CT data was obtained and used for attenuation correction and anatomic localization. Fasting blood glucose: 100 mg/dl COMPARISON:  CT neck 09/15/2019 FINDINGS: Mediastinal blood pool activity: SUV max 2.68 Liver activity: SUV max NA NECK: Asymmetric increased uptake localizing to the right glossotonsillar sulcus mass has an SUV max of 12.24. Multiple FDG avid right cervical lymph nodes identified. Index right level 2 node measures 2 cm  and has an SUV max of 6.8. No FDG avid left cervical lymph nodes. Incidental CT findings: none CHEST: No hypermetabolic mediastinal or hilar nodes. No suspicious pulmonary nodules on the CT scan. Incidental CT findings: Dependent changes and subsegmental atelectasis noted in the posterior lung bases. Lad coronary artery calcifications. Aortic atherosclerosis. ABDOMEN/PELVIS: No abnormal hypermetabolic activity within the liver, pancreas, adrenal glands, or spleen. No hypermetabolic lymph nodes in the abdomen or pelvis. Incidental CT findings: Fat containing umbilical hernia. Left kidney stones. Aortic atherosclerosis. SKELETON: No focal hypermetabolic activity to suggest skeletal metastasis. Incidental CT findings: none IMPRESSION: 1. There is marked increased radiotracer uptake localizing to the right glossotonsillar sulcus mass identified on recent CT. SUV max is equal to 12.24. 2. Multiple right cervical lymph nodes are FDG avid compatible with metastatic adenopathy. No FDG avid left cervical lymph nodes or distant metastatic disease. 3. Left kidney stones. 4. Lad coronary artery atherosclerotic calcifications. Electronically Signed   By: Kerby Moors M.D.   On: 10/07/2019 11:40  [place] Core Biopsy (lymph Nodes)  Result Date: 09/30/2019 INDICATION: 63 year old with a mass at the right glossotonsillar sulcus and right cervical lymphadenopathy. Tissue diagnosis is needed. EXAM: ULTRASOUND-GUIDED RIGHT CERVICAL LYMPH NODE BIOPSY MEDICATIONS: None. ANESTHESIA/SEDATION: None FLUOROSCOPY TIME:  None COMPLICATIONS: None immediate. PROCEDURE: Informed written consent was obtained from the patient after a thorough discussion of the procedural risks, benefits and alternatives. All questions were addressed. A timeout was performed prior to the initiation of the procedure. Ultrasound was used to evaluate the right side of the neck. Several abnormal hypoechoic lymph nodes were identified. Lymph node superficial to the  right internal jugular vein and right carotid artery was targeted. Skin was prepped with chlorhexidine and sterile field was created. Skin was anesthetized with 1% lidocaine. Using ultrasound guidance, core biopsies were obtained from the targeted lymph node with an 18 gauge device. Specimens placed on Telfa pad with saline. Bandage placed over the puncture site. FINDINGS: Several rounded hypoechoic lymph nodes in the right neck. Six core biopsies obtained and no immediate bleeding or hematoma formation. IMPRESSION: Ultrasound-guided core biopsies of a right cervical lymph node. Electronically Signed   By: Markus Daft M.D.   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  PLAN:    1. 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  Patient has no obvious metastatic disease.  Plan is to give weekly cisplatin throughout the duration of XRT.  Continue daily XRT.  Proceed with cycle 2 of weekly cisplatin.  Return to clinic in 1 week for further evaluation and consideration of cycle 3. 2.  Urinary retention: Unclear etiology, but possibly related to oral Compazine.  Patient has been instructed to take only Zofran for his nausea. 3.  Nausea: Discontinue Compazine as above.    Patient expressed understanding and was in agreement with this plan. He also understands that He can call clinic at any time with any questions, concerns, or complaints.    Bryan Huger, MD   10/28/2019 10:33 AM

## 2019-10-26 ENCOUNTER — Other Ambulatory Visit: Payer: Self-pay

## 2019-10-26 ENCOUNTER — Inpatient Hospital Stay (HOSPITAL_BASED_OUTPATIENT_CLINIC_OR_DEPARTMENT_OTHER): Payer: Medicare Other | Admitting: Oncology

## 2019-10-26 ENCOUNTER — Ambulatory Visit
Admission: RE | Admit: 2019-10-26 | Discharge: 2019-10-26 | Disposition: A | Discharge status: Home / Self Care | Payer: Medicare Other | Source: Ambulatory Visit | Attending: Radiation Oncology | Admitting: Radiation Oncology

## 2019-10-26 ENCOUNTER — Other Ambulatory Visit: Payer: Self-pay | Admitting: *Deleted

## 2019-10-26 ENCOUNTER — Encounter: Payer: Self-pay | Admitting: Oncology

## 2019-10-26 ENCOUNTER — Inpatient Hospital Stay: Payer: Medicare Other

## 2019-10-26 VITALS — BP 116/69 | HR 63 | Temp 97.2°F | Resp 20 | Ht 74.0 in | Wt 242.0 lb

## 2019-10-26 DIAGNOSIS — C099 Malignant neoplasm of tonsil, unspecified: Secondary | ICD-10-CM

## 2019-10-26 DIAGNOSIS — Z510 Encounter for antineoplastic radiation therapy: Secondary | ICD-10-CM | POA: Diagnosis not present

## 2019-10-26 DIAGNOSIS — R531 Weakness: Secondary | ICD-10-CM

## 2019-10-26 DIAGNOSIS — Z5189 Encounter for other specified aftercare: Secondary | ICD-10-CM

## 2019-10-26 DIAGNOSIS — Z5111 Encounter for antineoplastic chemotherapy: Secondary | ICD-10-CM | POA: Diagnosis not present

## 2019-10-26 LAB — COMPREHENSIVE METABOLIC PANEL
ALT: 22 U/L (ref 0–44)
AST: 18 U/L (ref 15–41)
Albumin: 4.1 g/dL (ref 3.5–5.0)
Alkaline Phosphatase: 53 U/L (ref 38–126)
Anion gap: 5 (ref 5–15)
BUN: 18 mg/dL (ref 8–23)
CO2: 27 mmol/L (ref 22–32)
Calcium: 9.2 mg/dL (ref 8.9–10.3)
Chloride: 103 mmol/L (ref 98–111)
Creatinine, Ser: 0.93 mg/dL (ref 0.61–1.24)
GFR calc Af Amer: >60 mL/min (ref >60)
GFR calc non Af Amer: >60 mL/min (ref >60)
Glucose, Bld: 118 mg/dL — ABNORMAL HIGH (ref 70–99)
Potassium: 4.2 mmol/L (ref 3.5–5.1)
Sodium: 135 mmol/L (ref 135–145)
Total Bilirubin: 1.2 mg/dL (ref 0.3–1.2)
Total Protein: 7.2 g/dL (ref 6.5–8.1)

## 2019-10-26 LAB — CBC WITH DIFFERENTIAL/PLATELET
Abs Immature Granulocytes: 0.02 10*3/uL (ref 0.00–0.07)
Basophils Absolute: 0 10*3/uL (ref 0.0–0.1)
Basophils Relative: 0 %
Eosinophils Absolute: 0.1 10*3/uL (ref 0.0–0.5)
Eosinophils Relative: 2 %
HCT: 47.3 % (ref 39.0–52.0)
Hemoglobin: 16.2 g/dL (ref 13.0–17.0)
Immature Granulocytes: 0 %
Lymphocytes Relative: 17 %
Lymphs Abs: 1.2 10*3/uL (ref 0.7–4.0)
MCH: 30.7 pg (ref 26.0–34.0)
MCHC: 34.2 g/dL (ref 30.0–36.0)
MCV: 89.8 fL (ref 80.0–100.0)
Monocytes Absolute: 0.6 10*3/uL (ref 0.1–1.0)
Monocytes Relative: 9 %
Neutro Abs: 5.1 10*3/uL (ref 1.7–7.7)
Neutrophils Relative %: 72 %
Platelets: 171 10*3/uL (ref 150–400)
RBC: 5.27 MIL/uL (ref 4.22–5.81)
RDW: 12.3 % (ref 11.5–15.5)
WBC: 7.1 10*3/uL (ref 4.0–10.5)
nRBC: 0 % (ref 0.0–0.2)

## 2019-10-26 NOTE — Patient Instructions (Signed)
Chemistry      Component Value Date/Time   NA 135 10/26/2019 0940   NA 139 01/09/2015 1632   K 4.2 10/26/2019 0940   K 3.5 01/09/2015 1632   CL 103 10/26/2019 0940   CL 102 01/09/2015 1632   CO2 27 10/26/2019 0940   CO2 30 01/09/2015 1632   BUN 18 10/26/2019 0940   BUN 14 01/09/2015 1632   CREATININE 0.93 10/26/2019 0940   CREATININE 0.95 01/09/2015 1632      Component Value Date/Time   CALCIUM 9.2 10/26/2019 0940   CALCIUM 8.7 01/09/2015 1632   ALKPHOS 53 10/26/2019 0940   ALKPHOS 98 01/09/2015 1632   AST 18 10/26/2019 0940   AST 138 (H) 01/09/2015 1632   ALT 22 10/26/2019 0940   ALT 225 (H) 01/09/2015 1632   BILITOT 1.2 10/26/2019 0940   BILITOT 4.0 (H) 01/09/2015 1632      CBC    Component Value Date/Time   WBC 7.1 10/26/2019 0940   RBC 5.27 10/26/2019 0940   HGB 16.2 10/26/2019 0940   HGB 15.4 01/09/2015 1632   HCT 47.3 10/26/2019 0940   HCT 45.1 01/09/2015 1632   PLT 171 10/26/2019 0940   PLT 181 01/09/2015 1632   MCV 89.8 10/26/2019 0940   MCV 92 01/09/2015 1632   MCH 30.7 10/26/2019 0940   MCHC 34.2 10/26/2019 0940   RDW 12.3 10/26/2019 0940   RDW 13.6 01/09/2015 1632   LYMPHSABS 1.2 10/26/2019 0940   LYMPHSABS 1.3 01/09/2015 1632   MONOABS 0.6 10/26/2019 0940   MONOABS 0.6 01/09/2015 1632   EOSABS 0.1 10/26/2019 0940   EOSABS 0.2 01/09/2015 1632   BASOSABS 0.0 10/26/2019 0940   BASOSABS 0.0 01/09/2015 1632   Hydration: Electrolyte supplements

## 2019-10-26 NOTE — Progress Notes (Signed)
Symptom Management Consult note [facility]  [phone] Fax:([phone]  Patient Care Team: Albina Billet, MD as PCP - General (Internal Medicine)   Name of the patient: Bryan Gomez  [id number]  08-18-1956   Date of visit: 10/26/2019   Diagnosis- Right Tonsil cancer  Chief complaint/ Reason for visit-discuss side effects from recent cisplatin chemotherapy.  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     Interval history-patient presents to symptom management today for complaints of feeling fatigued, constipated and dehydrated after first cycle of cisplatin.  First treatment was completed on 10/21/2019.  States the infusion went "well" but he started to feel poor on Friday evening which persisted through the weekend.  Associated symptoms include early satiety and constipation. Feels he had a period of "little urine output" followed by an increase in urine output on Friday and Saturday. He worried his kidneys are not responding to the chemo as they should.  He denies any dysuria, increased frequency or hesitancy.  He denies any hematuria.  Appetite has been stable and he has been drinking plenty of fluids.  His wife picked him up Gatorade and propel which he has been drinking.  Admits to constipation x2 to 3 days.   Denies any abdominal pain.  Has been taking Colace. He denies fevers, chest pain or shortness of breath.  Admits to intermittent nausea but denies vomiting.  ECOG FS:1 - Symptomatic but completely ambulatory  Review of systems- Review of Systems  Constitutional: Positive for malaise/fatigue. Negative for chills, fever and weight loss.  HENT: Negative for congestion, ear pain and tinnitus.   Eyes: Negative.  Negative for blurred vision and double vision.  Respiratory: Negative.  Negative for cough, sputum production and shortness of breath.   Cardiovascular: Negative.  Negative for chest pain, palpitations and leg swelling.  Gastrointestinal: Positive for constipation and nausea. Negative for abdominal pain, diarrhea and vomiting.  Genitourinary: Negative for dysuria, frequency and urgency.  Musculoskeletal: Negative for back pain and falls.  Skin: Negative.  Negative for rash.  Neurological: Positive for weakness. Negative for headaches.  Endo/Heme/Allergies: Negative.  Does not bruise/bleed easily.  Psychiatric/Behavioral: Negative for depression. The patient is nervous/anxious. The patient does not have insomnia.     Current treatment- S/p cycle 1 cisplatin  No Known Allergies   History reviewed. No pertinent past medical history.   Past Surgical History:  Procedure Laterality Date   ADENOIDECTOMY     INGUINAL HERNIA REPAIR Left    INGUINAL LYMPH NODE BIOPSY Right    removal of lymph node   TONSILLECTOMY      Social History   Socioeconomic History   Marital status: Married    Spouse name: Not on file   Number of children: Not on file   Years of education: Not on file   Highest education level: Not on file  Occupational History   Not on file  Social Needs   Financial resource strain: Not on file   Food insecurity    Worry: Not on file    Inability: Not on file   Transportation needs    Medical: Not on file    Non-medical: Not on file  Tobacco Use    Smoking status: Never Smoker   Smokeless tobacco: Never Used  Substance and Sexual Activity   Alcohol use: Yes   Drug use: Not Currently   Sexual activity: Not on file  Lifestyle   Physical activity    Days per week: Not on file    Minutes per session: Not on file   Stress: Not on file  Relationships   Social connections    Talks on phone: Not on file    Gets together: Not on file    Attends religious service: Not on file    Active member of club or organization: Not on file    Attends meetings of clubs or organizations: Not on file    Relationship status: Not on file   Intimate partner violence    Fear of current or ex partner: Not on file    Emotionally abused: Not on file    Physically abused: Not on file    Forced sexual activity: Not on file  Other Topics Concern   Not on file  Social History Narrative   Not on file    Family History  Problem Relation Age of Onset   Multiple myeloma Brother      Current Outpatient Medications:    chlorpheniramine (CHLOR-TRIMETON) 2 MG/5ML syrup, Take 2 mg by mouth every 4 (four) hours as needed for allergies., Disp: , Rfl:    diclofenac sodium (VOLTAREN) 1 % GEL, Voltaren 1 % topical gel  APPLY 2 GRAM TO THE AFFECTED AREA(S) BY TOPICAL ROUTE 4 TIMES PER DAY, Disp: , Rfl:    ondansetron (ZOFRAN) 8 MG tablet, Take 1 tablet (8 mg total) by mouth 2 (two) times daily as needed., Disp: 60 tablet, Rfl: 1   prochlorperazine (COMPAZINE) 10 MG tablet, Take 1 tablet (10 mg total) by mouth every 6 (six) hours as needed (Nausea or vomiting)., Disp: 60 tablet, Rfl: 1   sennosides-docusate sodium (SENOKOT-S) 8.6-50 MG tablet, Take 1 tablet by mouth daily., Disp: , Rfl:    SILDENAFIL CITRATE PO, CHEW 1 TO 2 TABLETS BY MOUTH 30 TO 45 MINUTES BEFORE SEXUAL ACTIVITY AS NEEDED, Disp: , Rfl:   Physical exam:  Vitals:   10/26/19 1019  BP: 116/69  Pulse: 63  Resp: 20  Temp: (!) 97.2 F (36.2 C)  TempSrc: Tympanic  Weight: 242 lb  (109.8 kg)  Height: 6' 2"  (1.88 m)   Physical Exam Constitutional:      Appearance: Normal appearance.  HENT:     Head: Normocephalic and atraumatic.  Eyes:     Pupils: Pupils are equal, round, and reactive to light.  Neck:     Musculoskeletal: Normal range of motion.  Cardiovascular:     Rate and Rhythm: Normal rate and regular rhythm.     Heart sounds: Normal heart sounds. No murmur.  Pulmonary:     Effort: Pulmonary effort is normal.     Breath sounds: Normal breath sounds. No wheezing.  Abdominal:     General: Bowel sounds are normal. There is no distension.     Palpations: Abdomen is soft.     Tenderness: There is no abdominal tenderness.  Musculoskeletal: Normal range  of motion.  Skin:    General: Skin is warm and dry.     Findings: No rash.  Neurological:     Mental Status: He is alert and oriented to person, place, and time.  Psychiatric:        Judgment: Judgment normal.      CMP Latest Ref Rng & Units 10/26/2019  Glucose 70 - 99 mg/dL 118(H)  BUN 8 - 23 mg/dL 18  Creatinine 0.61 - 1.24 mg/dL 0.93  Sodium 135 - 145 mmol/L 135  Potassium 3.5 - 5.1 mmol/L 4.2  Chloride 98 - 111 mmol/L 103  CO2 22 - 32 mmol/L 27  Calcium 8.9 - 10.3 mg/dL 9.2  Total Protein 6.5 - 8.1 g/dL 7.2  Total Bilirubin 0.3 - 1.2 mg/dL 1.2  Alkaline Phos 38 - 126 U/L 53  AST 15 - 41 U/L 18  ALT 0 - 44 U/L 22   CBC Latest Ref Rng & Units 10/26/2019  WBC 4.0 - 10.5 K/uL 7.1  Hemoglobin 13.0 - 17.0 g/dL 16.2  Hematocrit 39.0 - 52.0 % 47.3  Platelets 150 - 400 K/uL 171    No images are attached to the encounter.  Nm Pet Image Initial (pi) Skull Base To Thigh  Result Date: 10/07/2019 CLINICAL DATA:  Initial treatment strategy for head and neck cancer. EXAM: NUCLEAR MEDICINE PET SKULL BASE TO THIGH TECHNIQUE: 12.4 mCi F-18 FDG was injected intravenously. Full-ring PET imaging was performed from the skull base to thigh after the radiotracer. CT data was obtained and used for attenuation  correction and anatomic localization. Fasting blood glucose: 100 mg/dl COMPARISON:  CT neck 09/15/2019 FINDINGS: Mediastinal blood pool activity: SUV max 2.68 Liver activity: SUV max NA NECK: Asymmetric increased uptake localizing to the right glossotonsillar sulcus mass has an SUV max of 12.24. Multiple FDG avid right cervical lymph nodes identified. Index right level 2 node measures 2 cm and has an SUV max of 6.8. No FDG avid left cervical lymph nodes. Incidental CT findings: none CHEST: No hypermetabolic mediastinal or hilar nodes. No suspicious pulmonary nodules on the CT scan. Incidental CT findings: Dependent changes and subsegmental atelectasis noted in the posterior lung bases. Lad coronary artery calcifications. Aortic atherosclerosis. ABDOMEN/PELVIS: No abnormal hypermetabolic activity within the liver, pancreas, adrenal glands, or spleen. No hypermetabolic lymph nodes in the abdomen or pelvis. Incidental CT findings: Fat containing umbilical hernia. Left kidney stones. Aortic atherosclerosis. SKELETON: No focal hypermetabolic activity to suggest skeletal metastasis. Incidental CT findings: none IMPRESSION: 1. There is marked increased radiotracer uptake localizing to the right glossotonsillar sulcus mass identified on recent CT. SUV max is equal to 12.24. 2. Multiple right cervical lymph nodes are FDG avid compatible with metastatic adenopathy. No FDG avid left cervical lymph nodes or distant metastatic disease. 3. Left kidney stones. 4. Lad coronary artery atherosclerotic calcifications. Electronically Signed   By: Kerby Moors M.D.   On: 10/07/2019 11:40   [place] Core Biopsy (lymph Nodes)  Result Date: 09/30/2019 INDICATION: 63 year old with a mass at the right glossotonsillar sulcus and right cervical lymphadenopathy. Tissue diagnosis is needed. EXAM: ULTRASOUND-GUIDED RIGHT CERVICAL LYMPH NODE BIOPSY MEDICATIONS: None. ANESTHESIA/SEDATION: None FLUOROSCOPY TIME:  None COMPLICATIONS: None immediate.  PROCEDURE: Informed written consent was obtained from the patient after a thorough discussion of the procedural risks, benefits and alternatives. All questions were addressed. A timeout was performed prior to the initiation of the procedure. Ultrasound was used to evaluate the right side of the neck. Several abnormal hypoechoic lymph nodes were  identified. Lymph node superficial to the right internal jugular vein and right carotid artery was targeted. Skin was prepped with chlorhexidine and sterile field was created. Skin was anesthetized with 1% lidocaine. Using ultrasound guidance, core biopsies were obtained from the targeted lymph node with an 18 gauge device. Specimens placed on Telfa pad with saline. Bandage placed over the puncture site. FINDINGS: Several rounded hypoechoic lymph nodes in the right neck. Six core biopsies obtained and no immediate bleeding or hematoma formation. IMPRESSION: Ultrasound-guided core biopsies of a right cervical lymph node. Electronically Signed   By: Markus Daft M.D.   On: 09/30/2019 11:10     Assessment and plan- Patient is a 63 y.o. male who presents to discuss side effects he is experiencing from his recent chemotherapy.  Tonsillar cancer: He initially presented to his PCP with persistent sore throat and increased cervical lymphadenopathy.  Subsequent CT scan and biopsy confirmed metastatic squamous cell carcinoma of the tonsil.  Plan is for him to be treated with weekly cisplatin and concurrent XRT.  PET scan revealed cervical lymphadenopathy and right tonsillar mass.  He is status post 1 cycle of cisplatin and XRT.  Scheduled to return to clinic on 10/28/2019 for labs and consideration of cycle 2 cisplatin.  Weaknes/fatigue: Treatment related.  Will check lab work to review blood counts.  See plan below.  Nausea: Likely due to recent treatment.  Continue antiemetics as prescribed.  Constipation: Likely related to some mild dehydration.  He currently does not take  any narcotics.  Recommend Senokot and MiraLAX once daily for prophylaxis.  Plan: Stat labs. Stable. Vital signs.  Stable. We discussed in detail his lab results that are reassuring.  His kidney function is within normal range along with his blood counts.  I encouraged him to continue to stay hydrated and provided him with several Ensure rapid hydration packets.  He is receiving daily radiation to his tonsil which may cause irritation/inflammation of GI tract.  Recommend healios and amino acid supplement to help his mucosa of his esophagus.  Provided him with a sample of healios.  Disposition: RTC as scheduled for daily XRT. RTC on 10/28/19 for labs, md assess and consideration of cycle 2 cisplatin.  Visit Diagnosis 1. 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 Weakness     Patient expressed understanding and was in agreement with this plan. He also understands that He can call clinic at any time with any questions, concerns, or complaints.   Greater than 50% was spent in counseling and coordination of care with this patient including but not limited to discussion of the relevant topics above (See A&P) including, but not limited to diagnosis and management of acute and chronic medical conditions.   Thank you for allowing me to participate in the care of this very pleasant patient.    Jacquelin Hawking, NP [place] [facility] Cell - [phone] Pager- [phone] 10/26/2019 3:34 PM

## 2019-10-27 ENCOUNTER — Other Ambulatory Visit: Payer: Self-pay

## 2019-10-27 ENCOUNTER — Ambulatory Visit
Admission: RE | Admit: 2019-10-27 | Discharge: 2019-10-27 | Disposition: A | Discharge status: Home / Self Care | Payer: Medicare Other | Source: Ambulatory Visit | Attending: Radiation Oncology | Admitting: Radiation Oncology

## 2019-10-27 DIAGNOSIS — Z510 Encounter for antineoplastic radiation therapy: Secondary | ICD-10-CM | POA: Diagnosis not present

## 2019-10-28 ENCOUNTER — Inpatient Hospital Stay (HOSPITAL_BASED_OUTPATIENT_CLINIC_OR_DEPARTMENT_OTHER): Payer: Medicare Other | Admitting: Oncology

## 2019-10-28 ENCOUNTER — Ambulatory Visit
Admission: RE | Admit: 2019-10-28 | Discharge: 2019-10-28 | Disposition: A | Discharge status: Home / Self Care | Payer: Medicare Other | Source: Ambulatory Visit | Attending: Radiation Oncology | Admitting: Radiation Oncology

## 2019-10-28 ENCOUNTER — Other Ambulatory Visit: Payer: Self-pay

## 2019-10-28 ENCOUNTER — Inpatient Hospital Stay: Payer: Medicare Other

## 2019-10-28 VITALS — BP 127/74 | HR 64 | Temp 98.6°F | Resp 18 | Wt 239.8 lb

## 2019-10-28 DIAGNOSIS — C099 Malignant neoplasm of tonsil, unspecified: Secondary | ICD-10-CM

## 2019-10-28 DIAGNOSIS — Z510 Encounter for antineoplastic radiation therapy: Secondary | ICD-10-CM | POA: Diagnosis not present

## 2019-10-28 DIAGNOSIS — Z5111 Encounter for antineoplastic chemotherapy: Secondary | ICD-10-CM | POA: Diagnosis not present

## 2019-10-28 LAB — CBC WITH DIFFERENTIAL/PLATELET
Abs Immature Granulocytes: 0.03 10*3/uL (ref 0.00–0.07)
Basophils Absolute: 0 10*3/uL (ref 0.0–0.1)
Basophils Relative: 1 %
Eosinophils Absolute: 0.2 10*3/uL (ref 0.0–0.5)
Eosinophils Relative: 2 %
HCT: 45.7 % (ref 39.0–52.0)
Hemoglobin: 15.7 g/dL (ref 13.0–17.0)
Immature Granulocytes: 0 %
Lymphocytes Relative: 14 %
Lymphs Abs: 1.1 10*3/uL (ref 0.7–4.0)
MCH: 30.9 pg (ref 26.0–34.0)
MCHC: 34.4 g/dL (ref 30.0–36.0)
MCV: 90 fL (ref 80.0–100.0)
Monocytes Absolute: 0.8 10*3/uL (ref 0.1–1.0)
Monocytes Relative: 10 %
Neutro Abs: 5.7 10*3/uL (ref 1.7–7.7)
Neutrophils Relative %: 73 %
Platelets: 167 10*3/uL (ref 150–400)
RBC: 5.08 MIL/uL (ref 4.22–5.81)
RDW: 12.1 % (ref 11.5–15.5)
WBC: 7.8 10*3/uL (ref 4.0–10.5)
nRBC: 0 % (ref 0.0–0.2)

## 2019-10-28 LAB — BASIC METABOLIC PANEL
Anion gap: 6 (ref 5–15)
BUN: 23 mg/dL (ref 8–23)
CO2: 27 mmol/L (ref 22–32)
Calcium: 8.9 mg/dL (ref 8.9–10.3)
Chloride: 101 mmol/L (ref 98–111)
Creatinine, Ser: 1.14 mg/dL (ref 0.61–1.24)
GFR calc Af Amer: >60 mL/min (ref >60)
GFR calc non Af Amer: >60 mL/min (ref >60)
Glucose, Bld: 107 mg/dL — ABNORMAL HIGH (ref 70–99)
Potassium: 4.2 mmol/L (ref 3.5–5.1)
Sodium: 134 mmol/L — ABNORMAL LOW (ref 135–145)

## 2019-10-28 MED ORDER — POTASSIUM CHLORIDE 2 MEQ/ML IV SOLN
Freq: Once | INTRAVENOUS | Status: AC
  Administered 2019-10-28: 11:00:00 via INTRAVENOUS
  Filled 2019-10-28: qty 1000

## 2019-10-28 MED ORDER — SODIUM CHLORIDE 0.9 % IV SOLN
Freq: Once | INTRAVENOUS | Status: AC
  Administered 2019-10-28: 13:00:00 via INTRAVENOUS
  Filled 2019-10-28: qty 5

## 2019-10-28 MED ORDER — PALONOSETRON HCL INJECTION 0.25 MG/5ML
0.2500 mg | Freq: Once | INTRAVENOUS | Status: AC
  Administered 2019-10-28: 0.25 mg via INTRAVENOUS
  Filled 2019-10-28: qty 5

## 2019-10-28 MED ORDER — SODIUM CHLORIDE 0.9 % IV SOLN
Freq: Once | INTRAVENOUS | Status: AC
  Administered 2019-10-28: 10:00:00 via INTRAVENOUS
  Filled 2019-10-28: qty 250

## 2019-10-28 MED ORDER — SODIUM CHLORIDE 0.9 % IV SOLN
40.0000 mg/m2 | Freq: Once | INTRAVENOUS | Status: AC
  Administered 2019-10-28: 97 mg via INTRAVENOUS
  Filled 2019-10-28: qty 97

## 2019-10-28 NOTE — Progress Notes (Signed)
Patient is here for treatment and follow up he mentions he feels his thorat is starting to get sore but has no trouble swallowing.

## 2019-10-29 ENCOUNTER — Other Ambulatory Visit: Payer: Self-pay

## 2019-10-29 ENCOUNTER — Inpatient Hospital Stay: Payer: Medicare Other

## 2019-10-29 ENCOUNTER — Ambulatory Visit
Admission: RE | Admit: 2019-10-29 | Discharge: 2019-10-29 | Disposition: A | Discharge status: Home / Self Care | Payer: Medicare Other | Source: Ambulatory Visit | Attending: Radiation Oncology | Admitting: Radiation Oncology

## 2019-10-29 DIAGNOSIS — Z510 Encounter for antineoplastic radiation therapy: Secondary | ICD-10-CM | POA: Diagnosis not present

## 2019-10-29 NOTE — Progress Notes (Signed)
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  Patient started chemo and radiation on 11/11.    Met with patient following radiation this am.  Patient reports improvements in nausea after using zofran.  Has discontinued compazine due to urinary retention.  Reports mild sore thorat not effecting intake.  Reports appetite is decreased although still eating.  Reports enjoyed milkshake from [facility] this week.  Trying to eat 3 meals per day.    Reports constipation is some better.    Medications: zofran  Labs: Na 134  Anthropometrics:   Weight 239 lb 12.8 oz on 11/18 decreased from 248 lb on 10/29.  4% weight loss in the last 3 weeks, concerning   NUTRITION DIAGNOSIS: Predicted suboptimal energy intake continues with treament   INTERVENTION:  Discussed importance of nutrition during treatment.   Reviewed strategies to help with nausea.  Handout provided Discussed importance of calories and protein.  Examples of foods discussed Provided samples of different oral nutrition supplements for patient to try.  Has been using carnation breakfast essentials.  Contact information provided to patient     MONITORING, EVALUATION, GOAL: Patient will consume adequate calories and protein to maintain lean muscle mass.     NEXT VISIT: Dec 10 (Thursday) after radiation  Taran Hable B. Zenia Resides, [place], [place] Registered Dietitian [phone] (pager)

## 2019-10-30 ENCOUNTER — Other Ambulatory Visit: Payer: Self-pay

## 2019-10-30 ENCOUNTER — Ambulatory Visit
Admission: RE | Admit: 2019-10-30 | Discharge: 2019-10-30 | Disposition: A | Discharge status: Home / Self Care | Payer: Medicare Other | Source: Ambulatory Visit | Attending: Radiation Oncology | Admitting: Radiation Oncology

## 2019-10-30 DIAGNOSIS — Z510 Encounter for antineoplastic radiation therapy: Secondary | ICD-10-CM | POA: Diagnosis not present

## 2019-10-30 NOTE — Progress Notes (Signed)
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  INTERVAL HISTORY: Patient returns to clinic today for further evaluation and consideration of cycle 3 of weekly cisplatin.  Since discontinuing Compazine, his urinary retention has resolved.  He did not complain of increased nausea this week.  He currently feels well and is asymptomatic.  He had some mild bleeding with brushing his teeth which has also resolved.  He has no neurologic complaints.  He denies any recent fevers or illnesses.  He has a good appetite and denies weight loss.  He has no chest pain, shortness of breath, cough, or hemoptysis.  He has no nausea, vomiting, constipation, or diarrhea.  He has no urinary complaints.  Patient offers no specific complaints today.  REVIEW OF SYSTEMS:   Review of Systems  Constitutional: Negative.  Negative for fever, malaise/fatigue and weight loss.  Respiratory: Negative.  Negative for cough, hemoptysis and shortness of breath.   Cardiovascular: Negative.  Negative for chest pain and leg swelling.  Gastrointestinal: Negative.  Negative for abdominal pain.  Genitourinary: Negative.  Negative for dysuria.  Musculoskeletal: Negative.  Negative for back pain.  Skin: Negative.  Negative for rash.  Neurological: Negative.  Negative for dizziness, focal weakness, weakness and headaches.  Psychiatric/Behavioral: Negative.  The patient is not nervous/anxious.     As per HPI. Otherwise, a complete review of systems is negative.  PAST MEDICAL HISTORY: No past medical history on file.  PAST SURGICAL HISTORY: Past Surgical History:  Procedure Laterality Date  . ADENOIDECTOMY    . INGUINAL HERNIA REPAIR Left   . INGUINAL LYMPH NODE BIOPSY Right    removal  of lymph node  . TONSILLECTOMY      FAMILY HISTORY: Family History  Problem Relation Age of Onset  . Multiple myeloma Brother     ADVANCED DIRECTIVES (Y/N):  N  HEALTH MAINTENANCE: Social History   Tobacco Use  . Smoking status: Never Smoker  . Smokeless tobacco: Never Used  Substance Use Topics  . Alcohol use: Yes  . Drug use: Not Currently     Colonoscopy:  PAP:  Bone density:  Lipid panel:  No Known Allergies  Current Outpatient Medications  Medication Sig Dispense Refill  . chlorpheniramine (CHLOR-TRIMETON) 2 MG/5ML syrup Take 2 mg by mouth every 4 (four) hours as needed for allergies.    Marland Kitchen diclofenac sodium (VOLTAREN) 1 % GEL Voltaren 1 % topical gel  APPLY 2 GRAM TO THE AFFECTED AREA(S) BY TOPICAL ROUTE 4 TIMES PER DAY    . ondansetron (ZOFRAN) 8 MG tablet Take 1 tablet (8 mg total) by mouth 2 (two) times daily as needed. 60 tablet 1  . prochlorperazine (COMPAZINE) 10 MG tablet Take 1 tablet (10 mg total) by mouth every 6 (six) hours as needed (Nausea or vomiting). 60 tablet 1  . sennosides-docusate sodium (SENOKOT-S) 8.6-50 MG tablet Take 1 tablet by mouth daily.    Marland Kitchen SILDENAFIL CITRATE PO CHEW 1 TO 2 TABLETS BY MOUTH 30 TO 45 MINUTES BEFORE SEXUAL ACTIVITY AS NEEDED     No current facility-administered medications for this visit.    Facility-Administered Medications Ordered in Other Visits  Medication Dose Route Frequency Provider Last Rate Last Dose  . CISplatin (PLATINOL) 97 mg in sodium chloride 0.9 % 250  mL chemo infusion  40 mg/m2 (Treatment Plan Recorded) Intravenous Once Lloyd Huger, MD      . fosaprepitant (EMEND) 150 mg, dexamethasone (DECADRON) 12 mg in sodium chloride 0.9 % 145 mL IVPB   Intravenous Once Lloyd Huger, MD      . palonosetron (ALOXI) injection 0.25 mg  0.25 mg Intravenous Once Lloyd Huger, MD        OBJECTIVE: Vitals:   11/02/19 1410  BP: 126/62  Pulse: (!) 58  Resp: 20  Temp: (!) 96.8 F (36 C)  SpO2:  100%     Body mass index is 31.25 kg/m.    ECOG FS:0 - Asymptomatic  General: Well-developed, well-nourished, no acute distress. Eyes: Pink conjunctiva, anicteric sclera. HEENT: Normocephalic, moist mucous membranes, clear oropharnyx.  No palpable lymphadenopathy. Lungs: Clear to auscultation bilaterally. Heart: Regular rate and rhythm. No rubs, murmurs, or gallops. Abdomen: Soft, nontender, nondistended. No organomegaly noted, normoactive bowel sounds. Musculoskeletal: No edema, cyanosis, or clubbing. Neuro: Alert, answering all questions appropriately. Cranial nerves grossly intact. Skin: No rashes or petechiae noted. Psych: Normal affect.  LAB RESULTS:  Lab Results  Component Value Date   NA 136 11/04/2019   K 4.1 11/04/2019   CL 104 11/04/2019   CO2 28 11/04/2019   GLUCOSE 95 11/04/2019   BUN 18 11/04/2019   CREATININE 1.14 11/04/2019   CALCIUM 8.8 (L) 11/04/2019   PROT 7.2 10/26/2019   ALBUMIN 4.1 10/26/2019   AST 18 10/26/2019   ALT 22 10/26/2019   ALKPHOS 53 10/26/2019   BILITOT 1.2 10/26/2019   GFRNONAA >60 11/04/2019   GFRAA >60 11/04/2019    Lab Results  Component Value Date   WBC 6.4 11/04/2019   NEUTROABS 5.1 11/04/2019   HGB 14.2 11/04/2019   HCT 42.4 11/04/2019   MCV 92.0 11/04/2019   PLT 131 (L) 11/04/2019     STUDIES: Nm Pet Image Initial (pi) Skull Base To Thigh  Result Date: 10/07/2019 CLINICAL DATA:  Initial treatment strategy for head and neck cancer. EXAM: NUCLEAR MEDICINE PET SKULL BASE TO THIGH TECHNIQUE: 12.4 mCi F-18 FDG was injected intravenously. Full-ring PET imaging was performed from the skull base to thigh after the radiotracer. CT data was obtained and used for attenuation correction and anatomic localization. Fasting blood glucose: 100 mg/dl COMPARISON:  CT neck 09/15/2019 FINDINGS: Mediastinal blood pool activity: SUV max 2.68 Liver activity: SUV max NA NECK: Asymmetric increased uptake localizing to the right glossotonsillar  sulcus mass has an SUV max of 12.24. Multiple FDG avid right cervical lymph nodes identified. Index right level 2 node measures 2 cm and has an SUV max of 6.8. No FDG avid left cervical lymph nodes. Incidental CT findings: none CHEST: No hypermetabolic mediastinal or hilar nodes. No suspicious pulmonary nodules on the CT scan. Incidental CT findings: Dependent changes and subsegmental atelectasis noted in the posterior lung bases. Lad coronary artery calcifications. Aortic atherosclerosis. ABDOMEN/PELVIS: No abnormal hypermetabolic activity within the liver, pancreas, adrenal glands, or spleen. No hypermetabolic lymph nodes in the abdomen or pelvis. Incidental CT findings: Fat containing umbilical hernia. Left kidney stones. Aortic atherosclerosis. SKELETON: No focal hypermetabolic activity to suggest skeletal metastasis. Incidental CT findings: none IMPRESSION: 1. There is marked increased radiotracer uptake localizing to the right glossotonsillar sulcus mass identified on recent CT. SUV max is equal to 12.24. 2. Multiple right cervical lymph nodes are FDG avid compatible with metastatic adenopathy. No FDG avid left cervical lymph nodes or distant metastatic disease. 3. Left kidney  stones. 4. Lad coronary artery atherosclerotic calcifications. Electronically Signed   By: Kerby Moors M.D.   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  PLAN:    1. 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  Patient has no obvious metastatic disease.  Plan is to give weekly cisplatin throughout the duration of XRT.  Continue daily XRT.  Proceed with cycle 3 of weekly cisplatin.  Return to clinic in 1 week for further evaluation and consideration of cycle 4.   2.  Urinary retention: Resolved.  Likely related to oral Compazine.  This has been discontinued.  3.  Nausea: Patient does not complain of this today, continue Zofran as needed. 4.  Thrombocytopenia: Mild, monitor.    Patient expressed understanding and was in agreement with this plan. He also understands that He can call clinic at any time with any questions, concerns, or complaints.    Lloyd Huger, MD   11/04/2019 12:31 PM

## 2019-11-02 ENCOUNTER — Ambulatory Visit
Admission: RE | Admit: 2019-11-02 | Discharge: 2019-11-02 | Disposition: A | Discharge status: Home / Self Care | Payer: Medicare Other | Source: Ambulatory Visit | Attending: Radiation Oncology | Admitting: Radiation Oncology

## 2019-11-02 ENCOUNTER — Other Ambulatory Visit: Payer: Self-pay

## 2019-11-02 DIAGNOSIS — Z510 Encounter for antineoplastic radiation therapy: Secondary | ICD-10-CM | POA: Diagnosis not present

## 2019-11-02 NOTE — Progress Notes (Signed)
Patient is coming in for follow up, he is doing ok. He mentions his appetite is decreasing, his throat is getting more sore and tongue is tingling more. His pain is 5/10.

## 2019-11-03 ENCOUNTER — Ambulatory Visit
Admission: RE | Admit: 2019-11-03 | Discharge: 2019-11-03 | Disposition: A | Discharge status: Home / Self Care | Payer: Medicare Other | Source: Ambulatory Visit | Attending: Radiation Oncology | Admitting: Radiation Oncology

## 2019-11-03 ENCOUNTER — Other Ambulatory Visit: Payer: Self-pay

## 2019-11-03 DIAGNOSIS — Z510 Encounter for antineoplastic radiation therapy: Secondary | ICD-10-CM | POA: Diagnosis not present

## 2019-11-04 ENCOUNTER — Inpatient Hospital Stay: Payer: Medicare Other

## 2019-11-04 ENCOUNTER — Encounter: Payer: Self-pay | Admitting: Oncology

## 2019-11-04 ENCOUNTER — Other Ambulatory Visit: Payer: Self-pay

## 2019-11-04 ENCOUNTER — Ambulatory Visit
Admission: RE | Admit: 2019-11-04 | Discharge: 2019-11-04 | Disposition: A | Discharge status: Home / Self Care | Payer: Medicare Other | Source: Ambulatory Visit | Attending: Radiation Oncology | Admitting: Radiation Oncology

## 2019-11-04 ENCOUNTER — Inpatient Hospital Stay (HOSPITAL_BASED_OUTPATIENT_CLINIC_OR_DEPARTMENT_OTHER): Payer: Medicare Other | Admitting: Oncology

## 2019-11-04 VITALS — BP 126/62 | HR 58 | Temp 96.8°F | Resp 20 | Wt 243.4 lb

## 2019-11-04 VITALS — BP 113/64 | HR 58 | Temp 97.0°F | Resp 18

## 2019-11-04 DIAGNOSIS — C099 Malignant neoplasm of tonsil, unspecified: Secondary | ICD-10-CM

## 2019-11-04 DIAGNOSIS — Z510 Encounter for antineoplastic radiation therapy: Secondary | ICD-10-CM | POA: Diagnosis not present

## 2019-11-04 DIAGNOSIS — Z5111 Encounter for antineoplastic chemotherapy: Secondary | ICD-10-CM | POA: Diagnosis not present

## 2019-11-04 LAB — CBC WITH DIFFERENTIAL/PLATELET
Abs Immature Granulocytes: 0.02 10*3/uL (ref 0.00–0.07)
Basophils Absolute: 0 10*3/uL (ref 0.0–0.1)
Basophils Relative: 0 %
Eosinophils Absolute: 0.1 10*3/uL (ref 0.0–0.5)
Eosinophils Relative: 1 %
HCT: 42.4 % (ref 39.0–52.0)
Hemoglobin: 14.2 g/dL (ref 13.0–17.0)
Immature Granulocytes: 0 %
Lymphocytes Relative: 11 %
Lymphs Abs: 0.7 10*3/uL (ref 0.7–4.0)
MCH: 30.8 pg (ref 26.0–34.0)
MCHC: 33.5 g/dL (ref 30.0–36.0)
MCV: 92 fL (ref 80.0–100.0)
Monocytes Absolute: 0.5 10*3/uL (ref 0.1–1.0)
Monocytes Relative: 8 %
Neutro Abs: 5.1 10*3/uL (ref 1.7–7.7)
Neutrophils Relative %: 80 %
Platelets: 131 10*3/uL — ABNORMAL LOW (ref 150–400)
RBC: 4.61 MIL/uL (ref 4.22–5.81)
RDW: 12.2 % (ref 11.5–15.5)
WBC: 6.4 10*3/uL (ref 4.0–10.5)
nRBC: 0 % (ref 0.0–0.2)

## 2019-11-04 LAB — BASIC METABOLIC PANEL
Anion gap: 4 — ABNORMAL LOW (ref 5–15)
BUN: 18 mg/dL (ref 8–23)
CO2: 28 mmol/L (ref 22–32)
Calcium: 8.8 mg/dL — ABNORMAL LOW (ref 8.9–10.3)
Chloride: 104 mmol/L (ref 98–111)
Creatinine, Ser: 1.14 mg/dL (ref 0.61–1.24)
GFR calc Af Amer: >60 mL/min (ref >60)
GFR calc non Af Amer: >60 mL/min (ref >60)
Glucose, Bld: 95 mg/dL (ref 70–99)
Potassium: 4.1 mmol/L (ref 3.5–5.1)
Sodium: 136 mmol/L (ref 135–145)

## 2019-11-04 MED ORDER — SODIUM CHLORIDE 0.9 % IV SOLN
40.0000 mg/m2 | Freq: Once | INTRAVENOUS | Status: AC
  Administered 2019-11-04: 97 mg via INTRAVENOUS
  Filled 2019-11-04: qty 97

## 2019-11-04 MED ORDER — POTASSIUM CHLORIDE 2 MEQ/ML IV SOLN
Freq: Once | INTRAVENOUS | Status: AC
  Administered 2019-11-04: 11:00:00 via INTRAVENOUS
  Filled 2019-11-04: qty 1000

## 2019-11-04 MED ORDER — PALONOSETRON HCL INJECTION 0.25 MG/5ML
0.2500 mg | Freq: Once | INTRAVENOUS | Status: AC
  Administered 2019-11-04: 0.25 mg via INTRAVENOUS
  Filled 2019-11-04: qty 5

## 2019-11-04 MED ORDER — SODIUM CHLORIDE 0.9 % IV SOLN
Freq: Once | INTRAVENOUS | Status: AC
  Administered 2019-11-04: 10:00:00 via INTRAVENOUS
  Filled 2019-11-04: qty 250

## 2019-11-04 MED ORDER — SODIUM CHLORIDE 0.9 % IV SOLN
Freq: Once | INTRAVENOUS | Status: AC
  Administered 2019-11-04: 13:00:00 via INTRAVENOUS
  Filled 2019-11-04: qty 5

## 2019-11-04 NOTE — Progress Notes (Signed)
Patient is here for a follow up, says he is doing ok no complaints.

## 2019-11-04 NOTE — Progress Notes (Signed)
MD made aware that pt only voided 14mL prior to the start of his Cisplatin. Verbal order to proceed with Cisplatin. Pt and treatment team made aware and all questions answered at this time.   Lucette Kratz [employer]

## 2019-11-04 NOTE — Progress Notes (Signed)
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  INTERVAL HISTORY: Patient returns to clinic today for further evaluation and consideration of cycle 4 of weekly cisplatin.  He currently feels well and is asymptomatic.  He is tolerating his treatments without significant side effects. He has no neurologic complaints.  He denies any recent fevers or illnesses.  He has a good appetite and denies weight loss.  He has no chest pain, shortness of breath, cough, or hemoptysis.  He has no nausea, vomiting, constipation, or diarrhea.  He has no urinary complaints.  Patient offers no specific complaints today.  REVIEW OF SYSTEMS:   Review of Systems  Constitutional: Negative.  Negative for fever, malaise/fatigue and weight loss.  Respiratory: Negative.  Negative for cough, hemoptysis and shortness of breath.   Cardiovascular: Negative.  Negative for chest pain and leg swelling.  Gastrointestinal: Negative.  Negative for abdominal pain.  Genitourinary: Negative.  Negative for dysuria.  Musculoskeletal: Negative.  Negative for back pain.  Skin: Negative.  Negative for rash.  Neurological: Negative.  Negative for dizziness, focal weakness, weakness and headaches.  Psychiatric/Behavioral: Negative.  The patient is not nervous/anxious.     As per HPI. Otherwise, a complete review of systems is negative.  PAST MEDICAL HISTORY: No past medical history on file.  PAST SURGICAL HISTORY: Past Surgical History:  Procedure Laterality Date  . ADENOIDECTOMY    . INGUINAL HERNIA REPAIR Left   . INGUINAL LYMPH NODE BIOPSY Right    removal of lymph node  . TONSILLECTOMY      FAMILY HISTORY: Family History  Problem Relation Age of Onset  . Multiple myeloma  Brother     ADVANCED DIRECTIVES (Y/N):  N  HEALTH MAINTENANCE: Social History   Tobacco Use  . Smoking status: Never Smoker  . Smokeless tobacco: Never Used  Substance Use Topics  . Alcohol use: Yes  . Drug use: Not Currently     Colonoscopy:  PAP:  Bone density:  Lipid panel:  No Known Allergies  Current Outpatient Medications  Medication Sig Dispense Refill  . chlorpheniramine (CHLOR-TRIMETON) 2 MG/5ML syrup Take 2 mg by mouth every 4 (four) hours as needed for allergies.    Marland Kitchen diclofenac sodium (VOLTAREN) 1 % GEL Voltaren 1 % topical gel  APPLY 2 GRAM TO THE AFFECTED AREA(S) BY TOPICAL ROUTE 4 TIMES PER DAY    . ondansetron (ZOFRAN) 8 MG tablet Take 1 tablet (8 mg total) by mouth 2 (two) times daily as needed. 60 tablet 1  . prochlorperazine (COMPAZINE) 10 MG tablet Take 1 tablet (10 mg total) by mouth every 6 (six) hours as needed (Nausea or vomiting). 60 tablet 1  . sennosides-docusate sodium (SENOKOT-S) 8.6-50 MG tablet Take 1 tablet by mouth daily.    Marland Kitchen SILDENAFIL CITRATE PO CHEW 1 TO 2 TABLETS BY MOUTH 30 TO 45 MINUTES BEFORE SEXUAL ACTIVITY AS NEEDED     No current facility-administered medications for this visit.     OBJECTIVE: Vitals:   11/11/19 1134  BP: 114/77  Pulse: (!) 59  Temp: (!) 97 F (36.1 C)     Body mass index is 30.97 kg/m.    ECOG FS:0 - Asymptomatic  General: Well-developed, well-nourished, no acute distress. Eyes: Pink conjunctiva, anicteric sclera. HEENT: Normocephalic,  moist mucous membranes, clear oropharnyx.  No palpable lymphadenopathy. Lungs: Clear to auscultation bilaterally. Heart: Regular rate and rhythm. No rubs, murmurs, or gallops. Abdomen: Soft, nontender, nondistended. No organomegaly noted, normoactive bowel sounds. Musculoskeletal: No edema, cyanosis, or clubbing. Neuro: Alert, answering all questions appropriately. Cranial nerves grossly intact. Skin: No rashes or petechiae noted. Psych: Normal affect.  LAB RESULTS:   Lab Results  Component Value Date   NA 134 (L) 11/11/2019   K 4.5 11/11/2019   CL 105 11/11/2019   CO2 27 11/11/2019   GLUCOSE 104 (H) 11/11/2019   BUN 18 11/11/2019   CREATININE 1.00 11/11/2019   CALCIUM 8.3 (L) 11/11/2019   PROT 7.2 10/26/2019   ALBUMIN 4.1 10/26/2019   AST 18 10/26/2019   ALT 22 10/26/2019   ALKPHOS 53 10/26/2019   BILITOT 1.2 10/26/2019   GFRNONAA >60 11/11/2019   GFRAA >60 11/11/2019    Lab Results  Component Value Date   WBC 4.7 11/11/2019   NEUTROABS 3.4 11/11/2019   HGB 12.7 (L) 11/11/2019   HCT 37.4 (L) 11/11/2019   MCV 91.7 11/11/2019   PLT 111 (L) 11/11/2019     STUDIES: No results found.  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  PLAN:    1. 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  Patient has no obvious metastatic disease.  Plan is to give weekly cisplatin throughout the duration of XRT.  Continue daily XRT completing on December 15, 2019.  Return to clinic tomorrow for cycle 4 of weekly cisplatin.  Patient will then return to clinic in 1 week for further evaluation and consideration of cycle 5.  2.  Urinary retention: Resolved.  Likely related to oral Compazine.  This has been discontinued. 3.  Nausea: Patient does not complain of this today, continue Zofran as needed. 4.  Thrombocytopenia: Platelets have trended down slightly.  Continue to monitor.  Patient expressed understanding and was in agreement with this plan. He also understands that He can call clinic at any time with any questions, concerns, or complaints.    Lloyd Huger, MD   11/13/2019 7:09 AM

## 2019-11-09 ENCOUNTER — Other Ambulatory Visit: Payer: Self-pay

## 2019-11-09 ENCOUNTER — Ambulatory Visit
Admission: RE | Admit: 2019-11-09 | Discharge: 2019-11-09 | Disposition: A | Discharge status: Home / Self Care | Payer: Medicare Other | Source: Ambulatory Visit | Attending: Radiation Oncology | Admitting: Radiation Oncology

## 2019-11-09 DIAGNOSIS — Z510 Encounter for antineoplastic radiation therapy: Secondary | ICD-10-CM | POA: Diagnosis not present

## 2019-11-10 ENCOUNTER — Other Ambulatory Visit: Payer: Self-pay

## 2019-11-10 ENCOUNTER — Ambulatory Visit
Admission: RE | Admit: 2019-11-10 | Discharge: 2019-11-10 | Disposition: A | Discharge status: Home / Self Care | Payer: Medicare Other | Source: Ambulatory Visit | Attending: Radiation Oncology | Admitting: Radiation Oncology

## 2019-11-10 DIAGNOSIS — Z510 Encounter for antineoplastic radiation therapy: Secondary | ICD-10-CM | POA: Diagnosis not present

## 2019-11-10 DIAGNOSIS — C099 Malignant neoplasm of tonsil, unspecified: Secondary | ICD-10-CM | POA: Diagnosis present

## 2019-11-10 NOTE — Progress Notes (Signed)
Patient pre screened for office appointment, no questions or concerns today. Patient reminded of upcoming appointment time and date. 

## 2019-11-11 ENCOUNTER — Inpatient Hospital Stay: Payer: Medicare Other | Attending: Oncology

## 2019-11-11 ENCOUNTER — Other Ambulatory Visit: Payer: Self-pay

## 2019-11-11 ENCOUNTER — Ambulatory Visit
Admission: RE | Admit: 2019-11-11 | Discharge: 2019-11-11 | Disposition: A | Discharge status: Home / Self Care | Payer: Medicare Other | Source: Ambulatory Visit | Attending: Oncology | Admitting: Oncology

## 2019-11-11 ENCOUNTER — Inpatient Hospital Stay (HOSPITAL_BASED_OUTPATIENT_CLINIC_OR_DEPARTMENT_OTHER): Payer: Medicare Other | Admitting: Oncology

## 2019-11-11 VITALS — BP 114/77 | HR 59 | Temp 97.0°F | Wt 241.2 lb

## 2019-11-11 DIAGNOSIS — C099 Malignant neoplasm of tonsil, unspecified: Secondary | ICD-10-CM | POA: Insufficient documentation

## 2019-11-11 DIAGNOSIS — E860 Dehydration: Secondary | ICD-10-CM | POA: Insufficient documentation

## 2019-11-11 DIAGNOSIS — Z79899 Other long term (current) drug therapy: Secondary | ICD-10-CM | POA: Diagnosis not present

## 2019-11-11 DIAGNOSIS — N289 Disorder of kidney and ureter, unspecified: Secondary | ICD-10-CM | POA: Diagnosis not present

## 2019-11-11 DIAGNOSIS — Z5111 Encounter for antineoplastic chemotherapy: Secondary | ICD-10-CM | POA: Diagnosis present

## 2019-11-11 DIAGNOSIS — D696 Thrombocytopenia, unspecified: Secondary | ICD-10-CM | POA: Insufficient documentation

## 2019-11-11 DIAGNOSIS — Z510 Encounter for antineoplastic radiation therapy: Secondary | ICD-10-CM | POA: Diagnosis not present

## 2019-11-11 LAB — BASIC METABOLIC PANEL
Anion gap: 2 — ABNORMAL LOW (ref 5–15)
BUN: 18 mg/dL (ref 8–23)
CO2: 27 mmol/L (ref 22–32)
Calcium: 8.3 mg/dL — ABNORMAL LOW (ref 8.9–10.3)
Chloride: 105 mmol/L (ref 98–111)
Creatinine, Ser: 1 mg/dL (ref 0.61–1.24)
GFR calc Af Amer: >60 mL/min (ref >60)
GFR calc non Af Amer: >60 mL/min (ref >60)
Glucose, Bld: 104 mg/dL — ABNORMAL HIGH (ref 70–99)
Potassium: 4.5 mmol/L (ref 3.5–5.1)
Sodium: 134 mmol/L — ABNORMAL LOW (ref 135–145)

## 2019-11-11 LAB — CBC WITH DIFFERENTIAL/PLATELET
Abs Immature Granulocytes: 0.02 K/uL (ref 0.00–0.07)
Basophils Absolute: 0 K/uL (ref 0.0–0.1)
Basophils Relative: 1 %
Eosinophils Absolute: 0.1 K/uL (ref 0.0–0.5)
Eosinophils Relative: 2 %
HCT: 37.4 % — ABNORMAL LOW (ref 39.0–52.0)
Hemoglobin: 12.7 g/dL — ABNORMAL LOW (ref 13.0–17.0)
Immature Granulocytes: 0 %
Lymphocytes Relative: 14 %
Lymphs Abs: 0.6 K/uL — ABNORMAL LOW (ref 0.7–4.0)
MCH: 31.1 pg (ref 26.0–34.0)
MCHC: 34 g/dL (ref 30.0–36.0)
MCV: 91.7 fL (ref 80.0–100.0)
Monocytes Absolute: 0.5 K/uL (ref 0.1–1.0)
Monocytes Relative: 11 %
Neutro Abs: 3.4 K/uL (ref 1.7–7.7)
Neutrophils Relative %: 72 %
Platelets: 111 K/uL — ABNORMAL LOW (ref 150–400)
RBC: 4.08 MIL/uL — ABNORMAL LOW (ref 4.22–5.81)
RDW: 11.9 % (ref 11.5–15.5)
WBC: 4.7 K/uL (ref 4.0–10.5)
nRBC: 0 % (ref 0.0–0.2)

## 2019-11-12 ENCOUNTER — Other Ambulatory Visit: Payer: Self-pay

## 2019-11-12 ENCOUNTER — Inpatient Hospital Stay: Payer: Medicare Other

## 2019-11-12 ENCOUNTER — Ambulatory Visit
Admission: RE | Admit: 2019-11-12 | Discharge: 2019-11-12 | Disposition: A | Discharge status: Home / Self Care | Payer: Medicare Other | Source: Ambulatory Visit | Attending: Radiation Oncology | Admitting: Radiation Oncology

## 2019-11-12 VITALS — BP 108/71 | HR 68 | Temp 98.7°F | Resp 18

## 2019-11-12 DIAGNOSIS — Z510 Encounter for antineoplastic radiation therapy: Secondary | ICD-10-CM | POA: Diagnosis not present

## 2019-11-12 DIAGNOSIS — C099 Malignant neoplasm of tonsil, unspecified: Secondary | ICD-10-CM

## 2019-11-12 DIAGNOSIS — Z5111 Encounter for antineoplastic chemotherapy: Secondary | ICD-10-CM | POA: Diagnosis not present

## 2019-11-12 MED ORDER — POTASSIUM CHLORIDE 2 MEQ/ML IV SOLN
Freq: Once | INTRAVENOUS | Status: AC
  Administered 2019-11-12: 09:00:00 via INTRAVENOUS
  Filled 2019-11-12: qty 1000

## 2019-11-12 MED ORDER — SODIUM CHLORIDE 0.9 % IV SOLN
40.0000 mg/m2 | Freq: Once | INTRAVENOUS | Status: AC
  Administered 2019-11-12: 97 mg via INTRAVENOUS
  Filled 2019-11-12: qty 97

## 2019-11-12 MED ORDER — PALONOSETRON HCL INJECTION 0.25 MG/5ML
0.2500 mg | Freq: Once | INTRAVENOUS | Status: AC
  Administered 2019-11-12: 0.25 mg via INTRAVENOUS
  Filled 2019-11-12: qty 5

## 2019-11-12 MED ORDER — SODIUM CHLORIDE 0.9 % IV SOLN
Freq: Once | INTRAVENOUS | Status: AC
  Administered 2019-11-12: 11:00:00 via INTRAVENOUS
  Filled 2019-11-12: qty 5

## 2019-11-12 MED ORDER — SODIUM CHLORIDE 0.9 % IV SOLN
Freq: Once | INTRAVENOUS | Status: AC
  Administered 2019-11-12: 09:00:00 via INTRAVENOUS
  Filled 2019-11-12: qty 250

## 2019-11-13 ENCOUNTER — Other Ambulatory Visit: Payer: Self-pay

## 2019-11-13 ENCOUNTER — Ambulatory Visit
Admission: RE | Admit: 2019-11-13 | Discharge: 2019-11-13 | Disposition: A | Discharge status: Home / Self Care | Payer: Medicare Other | Source: Ambulatory Visit | Attending: Radiation Oncology | Admitting: Radiation Oncology

## 2019-11-13 DIAGNOSIS — Z510 Encounter for antineoplastic radiation therapy: Secondary | ICD-10-CM | POA: Diagnosis not present

## 2019-11-15 NOTE — Progress Notes (Signed)
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  INTERVAL HISTORY: Patient returns to clinic today for further evaluation and consideration of cycle 5 of weekly cisplatin.  He has had increased weakness and fatigue this past week with a poor appetite and minimal p.o. intake.  He also has increased dysphagia.  He has no neurologic complaints.  He denies any recent fevers or illnesses.  He has a good appetite and denies weight loss.  He has no chest pain, shortness of breath, cough, or hemoptysis.  He has no vomiting, constipation, or diarrhea.  He has no urinary complaints.  Patient feels generally terrible, but offers no further specific complaints today.    REVIEW OF SYSTEMS:   Review of Systems  Constitutional: Positive for malaise/fatigue. Negative for fever and weight loss.  HENT: Positive for sore throat.   Respiratory: Negative.  Negative for cough, hemoptysis and shortness of breath.   Cardiovascular: Negative.  Negative for chest pain and leg swelling.  Gastrointestinal: Positive for nausea. Negative for abdominal pain.  Genitourinary: Negative.  Negative for dysuria.  Musculoskeletal: Negative.  Negative for back pain.  Skin: Negative.  Negative for rash.  Neurological: Positive for weakness. Negative for dizziness, focal weakness and headaches.  Psychiatric/Behavioral: Negative.  The patient is not nervous/anxious.     As per HPI. Otherwise, a complete review of systems is negative.  PAST MEDICAL HISTORY: History reviewed. No pertinent past medical history.  PAST SURGICAL HISTORY: Past Surgical History:  Procedure Laterality Date  . ADENOIDECTOMY    . INGUINAL HERNIA REPAIR Left   . INGUINAL LYMPH NODE BIOPSY Right    removal of  lymph node  . TONSILLECTOMY      FAMILY HISTORY: Family History  Problem Relation Age of Onset  . Multiple myeloma Brother     ADVANCED DIRECTIVES (Y/N):  N  HEALTH MAINTENANCE: Social History   Tobacco Use  . Smoking status: Never Smoker  . Smokeless tobacco: Never Used  Substance Use Topics  . Alcohol use: Yes  . Drug use: Not Currently     Colonoscopy:  PAP:  Bone density:  Lipid panel:  No Known Allergies  Current Outpatient Medications  Medication Sig Dispense Refill  . chlorpheniramine (CHLOR-TRIMETON) 2 MG/5ML syrup Take 2 mg by mouth every 4 (four) hours as needed for allergies.    Marland Kitchen diclofenac sodium (VOLTAREN) 1 % GEL Voltaren 1 % topical gel  APPLY 2 GRAM TO THE AFFECTED AREA(S) BY TOPICAL ROUTE 4 TIMES PER DAY    . ondansetron (ZOFRAN) 8 MG tablet Take 1 tablet (8 mg total) by mouth 2 (two) times daily as needed. 60 tablet 1  . prochlorperazine (COMPAZINE) 10 MG tablet Take 1 tablet (10 mg total) by mouth every 6 (six) hours as needed (Nausea or vomiting). 60 tablet 1  . sennosides-docusate sodium (SENOKOT-S) 8.6-50 MG tablet Take 1 tablet by mouth daily.    Marland Kitchen SILDENAFIL CITRATE PO CHEW 1 TO 2 TABLETS BY MOUTH 30 TO 45 MINUTES BEFORE SEXUAL ACTIVITY AS NEEDED     No current facility-administered medications for this visit.    OBJECTIVE: Vitals:   11/18/19 0929  BP: 108/72  Pulse: 95  Resp: 15  Temp: (!) 97.3 F (36.3 C)     Body mass index  is 29.95 kg/m.    ECOG FS:0 - Asymptomatic  General: Well-developed, well-nourished, no acute distress. Eyes: Pink conjunctiva, anicteric sclera. HEENT: Normocephalic, moist mucous membranes, clear oropharnyx.  No palpable lymphadenopathy. Lungs: No audible wheezing or coughing. Heart: Regular rate and rhythm. Abdomen: Soft, nontender, nondistended. No organomegaly noted, normoactive bowel sounds. Musculoskeletal: No edema, cyanosis, or clubbing. Neuro: Alert, answering all questions appropriately. Cranial  nerves grossly intact. Skin: No rashes or petechiae noted. Psych: Normal affect.  LAB RESULTS:  Lab Results  Component Value Date   NA 138 11/18/2019   K 4.3 11/18/2019   CL 102 11/18/2019   CO2 28 11/18/2019   GLUCOSE 116 (H) 11/18/2019   BUN 21 11/18/2019   CREATININE 1.28 (H) 11/18/2019   CALCIUM 8.8 (L) 11/18/2019   PROT 7.2 10/26/2019   ALBUMIN 4.1 10/26/2019   AST 18 10/26/2019   ALT 22 10/26/2019   ALKPHOS 53 10/26/2019   BILITOT 1.2 10/26/2019   GFRNONAA 58 (L) 11/18/2019   GFRAA >60 11/18/2019    Lab Results  Component Value Date   WBC 3.6 (L) 11/18/2019   NEUTROABS 2.8 11/18/2019   HGB 12.3 (L) 11/18/2019   HCT 36.4 (L) 11/18/2019   MCV 91.0 11/18/2019   PLT 61 (L) 11/18/2019     STUDIES: No results found.  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  PLAN:    1. 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  Patient has no obvious metastatic disease.  Plan is to give weekly cisplatin throughout the duration of XRT.  Continue daily XRT completing on December 15, 2019.  Delay cycle 5 of weekly cisplatin secondary to thrombocytopenia.  Return to clinic in 1 week for further evaluation and reconsideration of treatment.  2.  Urinary retention: Resolved.  Likely related to oral Compazine.  This has been discontinued. 3.  Nausea: C continue Zofran as needed. 4.  Thrombocytopenia: Platelet count 61.  Delay treatment as above. 5.  Poor appetite/dehydration: Patient will receive 2 L IV fluids today. 6.  Renal insufficiency: IV fluids as above.  Patient expressed understanding and was in agreement with this plan. He also understands that He can call clinic at any time with any questions, concerns, or complaints.    Lloyd Huger, MD   11/19/2019 6:41 AM

## 2019-11-16 ENCOUNTER — Ambulatory Visit
Admission: RE | Admit: 2019-11-16 | Discharge: 2019-11-16 | Disposition: A | Discharge status: Home / Self Care | Payer: Medicare Other | Source: Ambulatory Visit | Attending: Radiation Oncology | Admitting: Radiation Oncology

## 2019-11-16 ENCOUNTER — Other Ambulatory Visit: Payer: Self-pay

## 2019-11-16 DIAGNOSIS — Z510 Encounter for antineoplastic radiation therapy: Secondary | ICD-10-CM | POA: Diagnosis not present

## 2019-11-17 ENCOUNTER — Ambulatory Visit
Admission: RE | Admit: 2019-11-17 | Discharge: 2019-11-17 | Disposition: A | Discharge status: Home / Self Care | Payer: Medicare Other | Source: Ambulatory Visit | Attending: Radiation Oncology | Admitting: Radiation Oncology

## 2019-11-17 ENCOUNTER — Other Ambulatory Visit: Payer: Self-pay

## 2019-11-17 DIAGNOSIS — Z510 Encounter for antineoplastic radiation therapy: Secondary | ICD-10-CM | POA: Diagnosis not present

## 2019-11-18 ENCOUNTER — Inpatient Hospital Stay: Payer: Medicare Other

## 2019-11-18 ENCOUNTER — Other Ambulatory Visit: Payer: Self-pay

## 2019-11-18 ENCOUNTER — Encounter: Payer: Self-pay | Admitting: Oncology

## 2019-11-18 ENCOUNTER — Inpatient Hospital Stay (HOSPITAL_BASED_OUTPATIENT_CLINIC_OR_DEPARTMENT_OTHER): Payer: Medicare Other | Admitting: Oncology

## 2019-11-18 ENCOUNTER — Ambulatory Visit
Admission: RE | Admit: 2019-11-18 | Discharge: 2019-11-18 | Disposition: A | Discharge status: Home / Self Care | Payer: Medicare Other | Source: Ambulatory Visit | Attending: Radiation Oncology | Admitting: Radiation Oncology

## 2019-11-18 VITALS — BP 108/72 | HR 95 | Temp 97.3°F | Resp 15 | Wt 233.3 lb

## 2019-11-18 VITALS — BP 107/63 | HR 58

## 2019-11-18 DIAGNOSIS — Z510 Encounter for antineoplastic radiation therapy: Secondary | ICD-10-CM | POA: Diagnosis not present

## 2019-11-18 DIAGNOSIS — C099 Malignant neoplasm of tonsil, unspecified: Secondary | ICD-10-CM | POA: Diagnosis not present

## 2019-11-18 DIAGNOSIS — Z5111 Encounter for antineoplastic chemotherapy: Secondary | ICD-10-CM | POA: Diagnosis not present

## 2019-11-18 LAB — BASIC METABOLIC PANEL
Anion gap: 8 (ref 5–15)
BUN: 21 mg/dL (ref 8–23)
CO2: 28 mmol/L (ref 22–32)
Calcium: 8.8 mg/dL — ABNORMAL LOW (ref 8.9–10.3)
Chloride: 102 mmol/L (ref 98–111)
Creatinine, Ser: 1.28 mg/dL — ABNORMAL HIGH (ref 0.61–1.24)
GFR calc Af Amer: >60 mL/min (ref >60)
GFR calc non Af Amer: 58 mL/min — ABNORMAL LOW (ref >60)
Glucose, Bld: 116 mg/dL — ABNORMAL HIGH (ref 70–99)
Potassium: 4.3 mmol/L (ref 3.5–5.1)
Sodium: 138 mmol/L (ref 135–145)

## 2019-11-18 LAB — CBC WITH DIFFERENTIAL/PLATELET
Abs Immature Granulocytes: 0.01 10*3/uL (ref 0.00–0.07)
Basophils Absolute: 0 10*3/uL (ref 0.0–0.1)
Basophils Relative: 0 %
Eosinophils Absolute: 0.1 10*3/uL (ref 0.0–0.5)
Eosinophils Relative: 3 %
HCT: 36.4 % — ABNORMAL LOW (ref 39.0–52.0)
Hemoglobin: 12.3 g/dL — ABNORMAL LOW (ref 13.0–17.0)
Immature Granulocytes: 0 %
Lymphocytes Relative: 13 %
Lymphs Abs: 0.5 10*3/uL — ABNORMAL LOW (ref 0.7–4.0)
MCH: 30.8 pg (ref 26.0–34.0)
MCHC: 33.8 g/dL (ref 30.0–36.0)
MCV: 91 fL (ref 80.0–100.0)
Monocytes Absolute: 0.3 10*3/uL (ref 0.1–1.0)
Monocytes Relative: 7 %
Neutro Abs: 2.8 10*3/uL (ref 1.7–7.7)
Neutrophils Relative %: 77 %
Platelets: 61 10*3/uL — ABNORMAL LOW (ref 150–400)
RBC: 4 MIL/uL — ABNORMAL LOW (ref 4.22–5.81)
RDW: 11.9 % (ref 11.5–15.5)
WBC: 3.6 10*3/uL — ABNORMAL LOW (ref 4.0–10.5)
nRBC: 0 % (ref 0.0–0.2)

## 2019-11-18 MED ORDER — SODIUM CHLORIDE 0.9 % IV SOLN
Freq: Once | INTRAVENOUS | Status: AC
  Administered 2019-11-18: 11:00:00 via INTRAVENOUS
  Filled 2019-11-18: qty 250

## 2019-11-18 NOTE — Progress Notes (Signed)
Per Mila Merry RN per Dr. Grayland Ormond no treatment today, pt to receive 2 liters of NS over 2 hours only.

## 2019-11-18 NOTE — Progress Notes (Signed)
Pt reports not being able to eat anything due to nausea; even just smelling food makes him nauseated. Reports he has had one episode of emesis. Also having issues with constipation without relief from senna.

## 2019-11-19 ENCOUNTER — Ambulatory Visit: Payer: Medicare Other

## 2019-11-19 ENCOUNTER — Other Ambulatory Visit: Payer: Self-pay

## 2019-11-19 ENCOUNTER — Other Ambulatory Visit: Payer: Self-pay | Admitting: *Deleted

## 2019-11-19 ENCOUNTER — Ambulatory Visit: Admission: RE | Admit: 2019-11-19 | Payer: Medicare Other | Source: Ambulatory Visit

## 2019-11-19 ENCOUNTER — Inpatient Hospital Stay: Payer: Medicare Other

## 2019-11-19 DIAGNOSIS — C099 Malignant neoplasm of tonsil, unspecified: Secondary | ICD-10-CM

## 2019-11-19 NOTE — Progress Notes (Signed)
Nutrition Follow-up:  Patient with stage IV squamous cell carcinoma of right tonsil.  Patient started on chemo and radiation on 11/11.  Treatment currently on hold due to low blood counts (chemo and radiation).   Met with patient in clinic today.  Patient reports that he has dry mouth, little taste or things he typically likes does not taste well.  Reports some nausea and takes zofran as needed.  Reports had chicken salad recently and couldn't eat it and gave it to the dog.  Reports that carnation breakfast essentials mixed with 2% milk is working well has one in the am.  Soups, greek yogurt are working well.  Drinking water, fruit juices and tomato juice, gingerale.  Does not have any dysphagia or pain on swallowing. Reports constipation. Typically having bowel movement about every 3 days but takes effort to have this bowel movement.   Noted received IV fluids yesterday in clinic.   Medications: senokot 1 tab in am and 2 in pm, ondesetron  Labs: creatinine 1.28, glucose 116 (12/9)  Anthropometrics:   Weight decreased to 233 lb 4.8 oz on 12/9 from 239 lb 12.8 oz on 11/18.   RD last saw patient on 11/19.    Noted 248 lb on 10/29  6% weight loss in 1 month and 9 days, significant   NUTRITION DIAGNOSIS: Predicted suboptimal energy intake continues   INTERVENTION:  Encouraged patient to take antiemetics daily not just prn.   Talked with Dr. Grayland Ormond about increasing bowel regimen.  MD recommended adding miralax 1-2 times daily as needed to current senokot regimen.  Discussed with patient.  Provided 1st complimentary case of ensure enlive to patient.  Discussed can drink up to 7 per day to better meet nutritional needs when unable to eat anything else.  Provided strategies to help with dry mouth.  Handout provided. Samples of ensure rapid hydration, ensure clear given to patient to try.     MONITORING, EVALUATION, GOAL: Patient will consume adequate calories and protein to maintain lean  muscle mass during treatment.    NEXT VISIT: Dec 17th during infusion  Cristofer Yaffe B. Zenia Resides, [place], [place] Registered Dietitian [phone] (pager)

## 2019-11-20 ENCOUNTER — Ambulatory Visit: Payer: Medicare Other

## 2019-11-20 ENCOUNTER — Encounter: Payer: Self-pay | Admitting: Oncology

## 2019-11-22 NOTE — Progress Notes (Signed)
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  INTERVAL HISTORY: Patient returns to clinic today for further evaluation and reconsideration of cycle 5 of weekly cisplatin.  His weakness and fatigue have improved. He has no neurologic complaints.  He denies any recent fevers or illnesses.  He has a fair appetite and denies weight loss.  He has no chest pain, shortness of breath, cough, or hemoptysis.  He has no nausea, vomiting, constipation, or diarrhea.  He has no urinary complaints.  Patient offers no further specific complaints today.  REVIEW OF SYSTEMS:   Review of Systems  Constitutional: Positive for malaise/fatigue. Negative for fever and weight loss.  HENT: Negative.  Negative for sore throat.   Respiratory: Negative.  Negative for cough, hemoptysis and shortness of breath.   Cardiovascular: Negative.  Negative for chest pain and leg swelling.  Gastrointestinal: Negative.  Negative for abdominal pain and nausea.  Genitourinary: Negative.  Negative for dysuria.  Musculoskeletal: Negative.  Negative for back pain.  Skin: Negative.  Negative for rash.  Neurological: Positive for weakness. Negative for dizziness, focal weakness and headaches.  Psychiatric/Behavioral: Negative.  The patient is not nervous/anxious.     As per HPI. Otherwise, a complete review of systems is negative.  PAST MEDICAL HISTORY: History reviewed. No pertinent past medical history.  PAST SURGICAL HISTORY: Past Surgical History:  Procedure Laterality Date  . ADENOIDECTOMY    . INGUINAL HERNIA REPAIR Left   . INGUINAL LYMPH NODE BIOPSY Right    removal of lymph node  . TONSILLECTOMY      FAMILY HISTORY: Family History  Problem Relation Age of Onset  .  Multiple myeloma Brother     ADVANCED DIRECTIVES (Y/N):  N  HEALTH MAINTENANCE: Social History   Tobacco Use  . Smoking status: Never Smoker  . Smokeless tobacco: Never Used  Substance Use Topics  . Alcohol use: Yes  . Drug use: Not Currently     Colonoscopy:  PAP:  Bone density:  Lipid panel:  No Known Allergies  Current Outpatient Medications  Medication Sig Dispense Refill  . chlorpheniramine (CHLOR-TRIMETON) 2 MG/5ML syrup Take 2 mg by mouth every 4 (four) hours as needed for allergies.    Marland Kitchen diclofenac sodium (VOLTAREN) 1 % GEL Voltaren 1 % topical gel  APPLY 2 GRAM TO THE AFFECTED AREA(S) BY TOPICAL ROUTE 4 TIMES PER DAY    . ondansetron (ZOFRAN) 8 MG tablet Take 1 tablet (8 mg total) by mouth 2 (two) times daily as needed. 60 tablet 1  . prochlorperazine (COMPAZINE) 10 MG tablet Take 1 tablet (10 mg total) by mouth every 6 (six) hours as needed (Nausea or vomiting). 60 tablet 1  . sennosides-docusate sodium (SENOKOT-S) 8.6-50 MG tablet Take 1 tablet by mouth daily.    Marland Kitchen SILDENAFIL CITRATE PO CHEW 1 TO 2 TABLETS BY MOUTH 30 TO 45 MINUTES BEFORE SEXUAL ACTIVITY AS NEEDED     No current facility-administered medications for this visit.    OBJECTIVE: Vitals:   11/26/19 0841  BP: 107/66  Pulse: 65  Temp: (!) 96.5 F (35.8 C)     Body mass index is 30.08 kg/m.    ECOG FS:0 - Asymptomatic  General: Well-developed, well-nourished, no acute distress. Eyes: Pink conjunctiva, anicteric sclera. HEENT: Normocephalic,  moist mucous membranes.  No obvious lymphadenopathy. Lungs: No audible wheezing or coughing. Heart: Regular rate and rhythm. Abdomen: Soft, nontender, no obvious distention. Musculoskeletal: No edema, cyanosis, or clubbing. Neuro: Alert, answering all questions appropriately. Cranial nerves grossly intact. Skin: No rashes or petechiae noted. Psych: Normal affect.  LAB RESULTS:  Lab Results  Component Value Date   NA 138 11/26/2019   K 4.4 11/26/2019    CL 104 11/26/2019   CO2 26 11/26/2019   GLUCOSE 114 (H) 11/26/2019   BUN 19 11/26/2019   CREATININE 1.14 11/26/2019   CALCIUM 9.0 11/26/2019   PROT 7.2 10/26/2019   ALBUMIN 4.1 10/26/2019   AST 18 10/26/2019   ALT 22 10/26/2019   ALKPHOS 53 10/26/2019   BILITOT 1.2 10/26/2019   GFRNONAA >60 11/26/2019   GFRAA >60 11/26/2019    Lab Results  Component Value Date   WBC 1.7 (L) 11/26/2019   NEUTROABS 0.9 (L) 11/26/2019   HGB 10.4 (L) 11/26/2019   HCT 29.6 (L) 11/26/2019   MCV 90.2 11/26/2019   PLT 67 (L) 11/26/2019     STUDIES: No results found.  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  PLAN:    1. 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  Patient has no obvious metastatic disease.  Plan is to give weekly cisplatin throughout the duration of XRT.  Continue daily XRT, completing on December 25, 2019.  We will once again delay cycle 5 of chemotherapy secondary to pancytopenia.  Return to clinic in 1 week for repeat laboratory work, further evaluation, and continuation of treatment if possible.   2.  Urinary retention: Resolved.  Likely related to oral Compazine.  This has been discontinued. 3.  Nausea: Continue Zofran as needed. 4.  Thrombocytopenia: Mildly improved to 67.  Delay treatment as above. 5.  Poor appetite/dehydration: Improved.  Continue to monitor. 6.  Renal insufficiency: Resolved. 7.  Leukopenia: Secondary to chemotherapy.  Delay treatment as above. 8.  IV access: Patient has requested port placement and a referral has been sent to surgery.  Patient expressed understanding and was in agreement with this plan. He also understands that He can call clinic at any time with any questions, concerns, or complaints.    Lloyd Huger, MD   11/26/2019 12:08 PM

## 2019-11-23 ENCOUNTER — Ambulatory Visit: Payer: Medicare Other

## 2019-11-23 ENCOUNTER — Encounter: Payer: Self-pay | Admitting: Emergency Medicine

## 2019-11-24 ENCOUNTER — Ambulatory Visit: Payer: Medicare Other

## 2019-11-25 ENCOUNTER — Ambulatory Visit: Payer: Medicare Other

## 2019-11-25 ENCOUNTER — Inpatient Hospital Stay: Payer: Medicare Other

## 2019-11-25 ENCOUNTER — Encounter: Payer: Self-pay | Admitting: Oncology

## 2019-11-25 ENCOUNTER — Other Ambulatory Visit: Payer: Self-pay | Admitting: *Deleted

## 2019-11-25 ENCOUNTER — Other Ambulatory Visit: Payer: Self-pay

## 2019-11-25 DIAGNOSIS — Z5111 Encounter for antineoplastic chemotherapy: Secondary | ICD-10-CM | POA: Diagnosis not present

## 2019-11-25 DIAGNOSIS — C099 Malignant neoplasm of tonsil, unspecified: Secondary | ICD-10-CM

## 2019-11-25 LAB — CBC
HCT: 30.1 % — ABNORMAL LOW (ref 39.0–52.0)
Hemoglobin: 10.6 g/dL — ABNORMAL LOW (ref 13.0–17.0)
MCH: 31.6 pg (ref 26.0–34.0)
MCHC: 35.2 g/dL (ref 30.0–36.0)
MCV: 89.9 fL (ref 80.0–100.0)
Platelets: 56 K/uL — ABNORMAL LOW (ref 150–400)
RBC: 3.35 MIL/uL — ABNORMAL LOW (ref 4.22–5.81)
RDW: 11.9 % (ref 11.5–15.5)
WBC: 1.9 K/uL — ABNORMAL LOW (ref 4.0–10.5)
nRBC: 0 % (ref 0.0–0.2)

## 2019-11-25 NOTE — Progress Notes (Signed)
Patient prescreened for appointment. Patient has no concerns or questions.  

## 2019-11-26 ENCOUNTER — Inpatient Hospital Stay: Payer: Medicare Other

## 2019-11-26 ENCOUNTER — Other Ambulatory Visit: Payer: Self-pay

## 2019-11-26 ENCOUNTER — Ambulatory Visit: Payer: Medicare Other

## 2019-11-26 ENCOUNTER — Inpatient Hospital Stay (HOSPITAL_BASED_OUTPATIENT_CLINIC_OR_DEPARTMENT_OTHER): Payer: Medicare Other | Admitting: Oncology

## 2019-11-26 VITALS — BP 107/66 | HR 65 | Temp 96.5°F | Wt 234.3 lb

## 2019-11-26 DIAGNOSIS — Z5111 Encounter for antineoplastic chemotherapy: Secondary | ICD-10-CM | POA: Diagnosis not present

## 2019-11-26 DIAGNOSIS — C099 Malignant neoplasm of tonsil, unspecified: Secondary | ICD-10-CM

## 2019-11-26 DIAGNOSIS — Z452 Encounter for adjustment and management of vascular access device: Secondary | ICD-10-CM

## 2019-11-26 LAB — BASIC METABOLIC PANEL
Anion gap: 8 (ref 5–15)
BUN: 19 mg/dL (ref 8–23)
CO2: 26 mmol/L (ref 22–32)
Calcium: 9 mg/dL (ref 8.9–10.3)
Chloride: 104 mmol/L (ref 98–111)
Creatinine, Ser: 1.14 mg/dL (ref 0.61–1.24)
GFR calc Af Amer: >60 mL/min (ref >60)
GFR calc non Af Amer: >60 mL/min (ref >60)
Glucose, Bld: 114 mg/dL — ABNORMAL HIGH (ref 70–99)
Potassium: 4.4 mmol/L (ref 3.5–5.1)
Sodium: 138 mmol/L (ref 135–145)

## 2019-11-26 LAB — CBC WITH DIFFERENTIAL/PLATELET
Abs Immature Granulocytes: 0.01 10*3/uL (ref 0.00–0.07)
Basophils Absolute: 0 10*3/uL (ref 0.0–0.1)
Basophils Relative: 0 %
Eosinophils Absolute: 0.1 10*3/uL (ref 0.0–0.5)
Eosinophils Relative: 4 %
HCT: 29.6 % — ABNORMAL LOW (ref 39.0–52.0)
Hemoglobin: 10.4 g/dL — ABNORMAL LOW (ref 13.0–17.0)
Immature Granulocytes: 1 %
Lymphocytes Relative: 26 %
Lymphs Abs: 0.4 10*3/uL — ABNORMAL LOW (ref 0.7–4.0)
MCH: 31.7 pg (ref 26.0–34.0)
MCHC: 35.1 g/dL (ref 30.0–36.0)
MCV: 90.2 fL (ref 80.0–100.0)
Monocytes Absolute: 0.2 10*3/uL (ref 0.1–1.0)
Monocytes Relative: 14 %
Neutro Abs: 0.9 10*3/uL — ABNORMAL LOW (ref 1.7–7.7)
Neutrophils Relative %: 55 %
Platelets: 67 10*3/uL — ABNORMAL LOW (ref 150–400)
RBC: 3.28 MIL/uL — ABNORMAL LOW (ref 4.22–5.81)
RDW: 12 % (ref 11.5–15.5)
WBC: 1.7 10*3/uL — ABNORMAL LOW (ref 4.0–10.5)
nRBC: 0 % (ref 0.0–0.2)

## 2019-11-26 NOTE — Progress Notes (Signed)
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  INTERVAL HISTORY: Patient returns to clinic today for further evaluation and reconsideration of cycle 5 of cisplatin.  He currently feels well and is nearly back to his baseline. He has no neurologic complaints.  He denies any recent fevers or illnesses.  He has a fair appetite and denies weight loss.  He has no chest pain, shortness of breath, cough, or hemoptysis.  He has no vomiting, constipation, or diarrhea.  He has no urinary complaints.  Patient offers no further specific complaints today.  REVIEW OF SYSTEMS:   Review of Systems  Constitutional: Negative.  Negative for fever, malaise/fatigue and weight loss.  HENT: Negative for sore throat.   Respiratory: Negative.  Negative for cough, hemoptysis and shortness of breath.   Cardiovascular: Negative.  Negative for chest pain and leg swelling.  Gastrointestinal: Negative.  Negative for abdominal pain and nausea.  Genitourinary: Negative.  Negative for dysuria.  Musculoskeletal: Negative.  Negative for back pain.  Skin: Negative.  Negative for rash.  Neurological: Negative.  Negative for dizziness, focal weakness, weakness and headaches.  Psychiatric/Behavioral: Negative.  The patient is not nervous/anxious.     As per HPI. Otherwise, a complete review of systems is negative.  PAST MEDICAL HISTORY: No past medical history on file.  PAST SURGICAL HISTORY: Past Surgical History:  Procedure Laterality Date  . ADENOIDECTOMY    . INGUINAL HERNIA REPAIR Left   . INGUINAL LYMPH NODE BIOPSY Right    removal of lymph node  . TONSILLECTOMY      FAMILY HISTORY: Family History  Problem Relation Age of Onset  . Multiple myeloma Brother      ADVANCED DIRECTIVES (Y/N):  N  HEALTH MAINTENANCE: Social History   Tobacco Use  . Smoking status: Never Smoker  . Smokeless tobacco: Never Used  Substance Use Topics  . Alcohol use: Yes  . Drug use: Not Currently     Colonoscopy:  PAP:  Bone density:  Lipid panel:  No Known Allergies  Current Outpatient Medications  Medication Sig Dispense Refill  . chlorpheniramine (CHLOR-TRIMETON) 2 MG/5ML syrup Take 2 mg by mouth every 4 (four) hours as needed for allergies.    Marland Kitchen diclofenac sodium (VOLTAREN) 1 % GEL Voltaren 1 % topical gel  APPLY 2 GRAM TO THE AFFECTED AREA(S) BY TOPICAL ROUTE 4 TIMES PER DAY    . ondansetron (ZOFRAN) 8 MG tablet Take 1 tablet (8 mg total) by mouth 2 (two) times daily as needed. 60 tablet 1  . prochlorperazine (COMPAZINE) 10 MG tablet Take 1 tablet (10 mg total) by mouth every 6 (six) hours as needed (Nausea or vomiting). 60 tablet 1  . sennosides-docusate sodium (SENOKOT-S) 8.6-50 MG tablet Take 1 tablet by mouth daily.    Marland Kitchen SILDENAFIL CITRATE PO CHEW 1 TO 2 TABLETS BY MOUTH 30 TO 45 MINUTES BEFORE SEXUAL ACTIVITY AS NEEDED     No current facility-administered medications for this visit.   Facility-Administered Medications Ordered in Other Visits  Medication Dose Route Frequency Provider Last Rate Last Admin  . CISplatin (PLATINOL) 73 mg in sodium chloride 0.9 % 250 mL chemo infusion  30 mg/m2 (Treatment Plan Recorded) Intravenous Once Lloyd Huger, MD      . fosaprepitant (EMEND)  150 mg, dexamethasone (DECADRON) 12 mg in sodium chloride 0.9 % 145 mL IVPB   Intravenous Once Lloyd Huger, MD      . palonosetron (ALOXI) injection 0.25 mg  0.25 mg Intravenous Once Lloyd Huger, MD        OBJECTIVE: Vitals:   12/02/19 0925  BP: 125/64  Pulse: 81  Temp: 97.9 F (36.6 C)     Body mass index is 29.93 kg/m.    ECOG FS:0 - Asymptomatic  General: Well-developed, well-nourished, no acute distress. Eyes: Pink conjunctiva, anicteric  sclera. HEENT: Normocephalic, moist mucous membranes.  No palpable lymphadenopathy. Lungs: No audible wheezing or coughing. Heart: Regular rate and rhythm. Abdomen: Soft, nontender, no obvious distention. Musculoskeletal: No edema, cyanosis, or clubbing. Neuro: Alert, answering all questions appropriately. Cranial nerves grossly intact. Skin: No rashes or petechiae noted. Psych: Normal affect.  LAB RESULTS:  Lab Results  Component Value Date   NA 139 12/02/2019   K 4.0 12/02/2019   CL 105 12/02/2019   CO2 27 12/02/2019   GLUCOSE 102 (H) 12/02/2019   BUN 15 12/02/2019   CREATININE 1.25 (H) 12/02/2019   CALCIUM 8.8 (L) 12/02/2019   PROT 7.2 10/26/2019   ALBUMIN 4.1 10/26/2019   AST 18 10/26/2019   ALT 22 10/26/2019   ALKPHOS 53 10/26/2019   BILITOT 1.2 10/26/2019   GFRNONAA >60 12/02/2019   GFRAA >60 12/02/2019    Lab Results  Component Value Date   WBC 1.4 (LL) 12/02/2019   NEUTROABS 0.6 (L) 12/02/2019   HGB 9.7 (L) 12/02/2019   HCT 28.1 (L) 12/02/2019   MCV 90.4 12/02/2019   PLT 136 (L) 12/02/2019     STUDIES: No results found.  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  PLAN:    1. 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  Patient has no obvious metastatic disease.  Plan is to give weekly cisplatin throughout the duration of XRT.  Continue daily XRT completing on December 25, 2019.  Despite persistent neutropenia, will proceed with dose reduced cisplatin today.  Return to clinic in 1 week for further evaluation and consideration of cycle 6.   2.  Urinary retention: Resolved.  Likely related to oral Compazine.  This has been discontinued. 3.  Nausea: Patient does not complain of this today.  Continue Zofran as needed. 4.  Thrombocytopenia: Platelet count improved to 136.  Proceed with dose reduced cisplatin as above. 5.  Poor  appetite/dehydration: Resolved. 6.  Renal insufficiency: Improved.  Patient creatinine is 1.25.  Dose reduced cisplatin as above. 7.  IV access: Patient discussed possible port placement with surgery this past week, but will hold off until absolutely necessary. 8.  Neutropenia: Proceed cautiously with treatment as above.  Patient expressed understanding and was in agreement with this plan. He also understands that He can call clinic at any time with any questions, concerns, or complaints.    Lloyd Huger, MD   12/02/2019 11:48 AM

## 2019-11-26 NOTE — Progress Notes (Signed)
Nutrition Follow-up:  Patient with stage IV squamous cell carcinoma of right tonsil.  Patient started on chemo and radiation on 11/11.  Treatment on hold due to low platelets.    Met with patient in clinic today.  Patient reports that nausea and constipation has improved.  Having bowel movement daily or every other day.  Reports that he is mainly drinking most of his calories, 2 ensure enlive daily, 1 carnation instant breakfast. Recently tried equate high protein shake (30 g protein, 180 calories).  Reports ate chicken casserole and coleslaw and pudding last night for dinner. No pain on swallowing.  Has been doing oral rinses.  Lack of taste continues to effect overall intake. Trying to do baking soda, salt water rinses.   Medications: senokot and miralax, ondansetron  Labs: glucose 114  Anthropometrics:   Weight 234 lb  4.8 oz today slight increase from 233 lb 4.8 oz on 12/9, overall weight decreased.    NUTRITION DIAGNOSIS: Predicted suboptimal energy intake continues   INTERVENTION:  Recommend higher calorie shake vs higher protein shake at this time if patient able to tolerate.  Can drink up to 7 ensure enlive daily to better meet nutritional needs.  Continue bowel regimen, nausea medications and rinses to help with side effects.   Encouraged hydration.  Patient has contact information    MONITORING, EVALUATION, GOAL: Patient will consume adequate calories and protein to maintain lean muscle mass during treatment   NEXT VISIT: Dec 28th following radiation  Darby Shadwick B. Zenia Resides, [place], [place] Registered Dietitian [phone] (pager)

## 2019-11-27 ENCOUNTER — Ambulatory Visit: Payer: Medicare Other

## 2019-11-30 ENCOUNTER — Other Ambulatory Visit: Payer: Self-pay

## 2019-11-30 ENCOUNTER — Inpatient Hospital Stay: Payer: Medicare Other

## 2019-11-30 ENCOUNTER — Ambulatory Visit
Admission: RE | Admit: 2019-11-30 | Discharge: 2019-11-30 | Disposition: A | Discharge status: Home / Self Care | Payer: Medicare Other | Source: Ambulatory Visit | Attending: Radiation Oncology | Admitting: Radiation Oncology

## 2019-11-30 DIAGNOSIS — Z5111 Encounter for antineoplastic chemotherapy: Secondary | ICD-10-CM | POA: Diagnosis not present

## 2019-11-30 DIAGNOSIS — C099 Malignant neoplasm of tonsil, unspecified: Secondary | ICD-10-CM

## 2019-11-30 DIAGNOSIS — Z510 Encounter for antineoplastic radiation therapy: Secondary | ICD-10-CM | POA: Diagnosis not present

## 2019-11-30 LAB — CBC
HCT: 30.5 % — ABNORMAL LOW (ref 39.0–52.0)
Hemoglobin: 10.4 g/dL — ABNORMAL LOW (ref 13.0–17.0)
MCH: 30.5 pg (ref 26.0–34.0)
MCHC: 34.1 g/dL (ref 30.0–36.0)
MCV: 89.4 fL (ref 80.0–100.0)
Platelets: 111 K/uL — ABNORMAL LOW (ref 150–400)
RBC: 3.41 MIL/uL — ABNORMAL LOW (ref 4.22–5.81)
RDW: 12.6 % (ref 11.5–15.5)
WBC: 1.5 K/uL — ABNORMAL LOW (ref 4.0–10.5)
nRBC: 0 % (ref 0.0–0.2)

## 2019-12-01 ENCOUNTER — Ambulatory Visit
Admission: RE | Admit: 2019-12-01 | Discharge: 2019-12-01 | Disposition: A | Discharge status: Home / Self Care | Payer: Medicare Other | Source: Ambulatory Visit | Attending: Radiation Oncology | Admitting: Radiation Oncology

## 2019-12-01 ENCOUNTER — Other Ambulatory Visit: Payer: Self-pay

## 2019-12-01 DIAGNOSIS — Z510 Encounter for antineoplastic radiation therapy: Secondary | ICD-10-CM | POA: Diagnosis not present

## 2019-12-01 NOTE — Progress Notes (Unsigned)
Patient pre screened for office appointment, no questions or concerns today. Patient reminded of upcoming appointment time and date. 

## 2019-12-02 ENCOUNTER — Inpatient Hospital Stay: Payer: Medicare Other

## 2019-12-02 ENCOUNTER — Ambulatory Visit
Admission: RE | Admit: 2019-12-02 | Discharge: 2019-12-02 | Disposition: A | Discharge status: Home / Self Care | Payer: Medicare Other | Source: Ambulatory Visit | Attending: Radiation Oncology | Admitting: Radiation Oncology

## 2019-12-02 ENCOUNTER — Other Ambulatory Visit: Payer: Self-pay

## 2019-12-02 ENCOUNTER — Inpatient Hospital Stay (HOSPITAL_BASED_OUTPATIENT_CLINIC_OR_DEPARTMENT_OTHER): Payer: Medicare Other | Admitting: Oncology

## 2019-12-02 VITALS — BP 125/64 | HR 81 | Temp 97.9°F | Wt 233.1 lb

## 2019-12-02 DIAGNOSIS — Z510 Encounter for antineoplastic radiation therapy: Secondary | ICD-10-CM | POA: Diagnosis not present

## 2019-12-02 DIAGNOSIS — C099 Malignant neoplasm of tonsil, unspecified: Secondary | ICD-10-CM | POA: Diagnosis not present

## 2019-12-02 DIAGNOSIS — Z5111 Encounter for antineoplastic chemotherapy: Secondary | ICD-10-CM | POA: Diagnosis not present

## 2019-12-02 LAB — CBC WITH DIFFERENTIAL/PLATELET
Abs Immature Granulocytes: 0.01 10*3/uL (ref 0.00–0.07)
Basophils Absolute: 0 10*3/uL (ref 0.0–0.1)
Basophils Relative: 1 %
Eosinophils Absolute: 0 10*3/uL (ref 0.0–0.5)
Eosinophils Relative: 3 %
HCT: 28.1 % — ABNORMAL LOW (ref 39.0–52.0)
Hemoglobin: 9.7 g/dL — ABNORMAL LOW (ref 13.0–17.0)
Immature Granulocytes: 1 %
Lymphocytes Relative: 33 %
Lymphs Abs: 0.5 10*3/uL — ABNORMAL LOW (ref 0.7–4.0)
MCH: 31.2 pg (ref 26.0–34.0)
MCHC: 34.5 g/dL (ref 30.0–36.0)
MCV: 90.4 fL (ref 80.0–100.0)
Monocytes Absolute: 0.3 10*3/uL (ref 0.1–1.0)
Monocytes Relative: 20 %
Neutro Abs: 0.6 10*3/uL — ABNORMAL LOW (ref 1.7–7.7)
Neutrophils Relative %: 42 %
Platelets: 136 10*3/uL — ABNORMAL LOW (ref 150–400)
RBC: 3.11 MIL/uL — ABNORMAL LOW (ref 4.22–5.81)
RDW: 13 % (ref 11.5–15.5)
WBC: 1.4 10*3/uL — CL (ref 4.0–10.5)
nRBC: 0 % (ref 0.0–0.2)

## 2019-12-02 LAB — BASIC METABOLIC PANEL
Anion gap: 7 (ref 5–15)
BUN: 15 mg/dL (ref 8–23)
CO2: 27 mmol/L (ref 22–32)
Calcium: 8.8 mg/dL — ABNORMAL LOW (ref 8.9–10.3)
Chloride: 105 mmol/L (ref 98–111)
Creatinine, Ser: 1.25 mg/dL — ABNORMAL HIGH (ref 0.61–1.24)
GFR calc Af Amer: >60 mL/min (ref >60)
GFR calc non Af Amer: >60 mL/min (ref >60)
Glucose, Bld: 102 mg/dL — ABNORMAL HIGH (ref 70–99)
Potassium: 4 mmol/L (ref 3.5–5.1)
Sodium: 139 mmol/L (ref 135–145)

## 2019-12-02 MED ORDER — POTASSIUM CHLORIDE 2 MEQ/ML IV SOLN
Freq: Once | INTRAVENOUS | Status: AC
  Filled 2019-12-02: qty 1000

## 2019-12-02 MED ORDER — PALONOSETRON HCL INJECTION 0.25 MG/5ML
0.2500 mg | Freq: Once | INTRAVENOUS | Status: AC
  Administered 2019-12-02: 0.25 mg via INTRAVENOUS
  Filled 2019-12-02: qty 5

## 2019-12-02 MED ORDER — SODIUM CHLORIDE 0.9 % IV SOLN
30.0000 mg/m2 | Freq: Once | INTRAVENOUS | Status: AC
  Administered 2019-12-02: 73 mg via INTRAVENOUS
  Filled 2019-12-02: qty 73

## 2019-12-02 MED ORDER — SODIUM CHLORIDE 0.9 % IV SOLN
Freq: Once | INTRAVENOUS | Status: AC
  Filled 2019-12-02: qty 5

## 2019-12-02 MED ORDER — SODIUM CHLORIDE 0.9 % IV SOLN
Freq: Once | INTRAVENOUS | Status: AC
  Filled 2019-12-02: qty 250

## 2019-12-02 NOTE — Progress Notes (Signed)
Per Dr. Grayland Ormond and per MD treatment conditions okay to proceed with dose reduced Cisplatin treatment with 12/02/2019 labs (including WBC 1.4, ANC 0.6 and BMP). Per Dr. Grayland Ormond no Girard Cooter or Granix at this time, no additional orders at this time.   1515:Pt educated on taking neutropenic precautions, when to report to ER/call 911, and when to call clinic. Pt verbalizes understanding, pt denies any questions or concerns at this time. Pt stable at time of discharge.

## 2019-12-02 NOTE — Progress Notes (Signed)
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  INTERVAL HISTORY: Patient returns to clinic today for further evaluation and consideration of cycle 6 of dose reduced cisplatin.  He currently feels well and is asymptomatic. He has no neurologic complaints.  He denies any recent fevers or illnesses.  He has a fair appetite and denies weight loss.  He has no chest pain, shortness of breath, cough, or hemoptysis.  He has no vomiting, constipation, or diarrhea.  He has no urinary complaints.  Patient offers no specific complaints today.  REVIEW OF SYSTEMS:   Review of Systems  Constitutional: Negative.  Negative for fever, malaise/fatigue and weight loss.  HENT: Negative for sore throat.   Respiratory: Negative.  Negative for cough, hemoptysis and shortness of breath.   Cardiovascular: Negative.  Negative for chest pain and leg swelling.  Gastrointestinal: Negative.  Negative for abdominal pain and nausea.  Genitourinary: Negative.  Negative for dysuria.  Musculoskeletal: Negative.  Negative for back pain.  Skin: Negative.  Negative for rash.  Neurological: Negative.  Negative for dizziness, focal weakness, weakness and headaches.  Psychiatric/Behavioral: Negative.  The patient is not nervous/anxious.     As per HPI. Otherwise, a complete review of systems is negative.  PAST MEDICAL HISTORY: No past medical history on file.  PAST SURGICAL HISTORY: Past Surgical History:  Procedure Laterality Date  . ADENOIDECTOMY    . INGUINAL HERNIA REPAIR Left   . INGUINAL LYMPH NODE BIOPSY Right    removal of lymph node  . TONSILLECTOMY      FAMILY HISTORY: Family History  Problem Relation Age of Onset  . Multiple myeloma Brother     ADVANCED  DIRECTIVES (Y/N):  N  HEALTH MAINTENANCE: Social History   Tobacco Use  . Smoking status: Never Smoker  . Smokeless tobacco: Never Used  Substance Use Topics  . Alcohol use: Yes  . Drug use: Not Currently     Colonoscopy:  PAP:  Bone density:  Lipid panel:  No Known Allergies  Current Outpatient Medications  Medication Sig Dispense Refill  . chlorpheniramine (CHLOR-TRIMETON) 2 MG/5ML syrup Take 2 mg by mouth every 4 (four) hours as needed for allergies.    Marland Kitchen diclofenac sodium (VOLTAREN) 1 % GEL Voltaren 1 % topical gel  APPLY 2 GRAM TO THE AFFECTED AREA(S) BY TOPICAL ROUTE 4 TIMES PER DAY    . ondansetron (ZOFRAN) 8 MG tablet Take 1 tablet (8 mg total) by mouth 2 (two) times daily as needed. 60 tablet 1  . prochlorperazine (COMPAZINE) 10 MG tablet Take 1 tablet (10 mg total) by mouth every 6 (six) hours as needed (Nausea or vomiting). 60 tablet 1  . sennosides-docusate sodium (SENOKOT-S) 8.6-50 MG tablet Take 1 tablet by mouth daily.    Marland Kitchen SILDENAFIL CITRATE PO CHEW 1 TO 2 TABLETS BY MOUTH 30 TO 45 MINUTES BEFORE SEXUAL ACTIVITY AS NEEDED     No current facility-administered medications for this visit.    OBJECTIVE: Vitals:   12/09/19 0902  BP: 111/60  Pulse: 63  Resp: 18  Temp: (!) 97.3 F (36.3 C)     Body mass index is 29.27 kg/m.    ECOG FS:0 - Asymptomatic  General: Well-developed, well-nourished, no acute distress. Eyes: Pink conjunctiva, anicteric sclera. HEENT: Normocephalic,  moist mucous membranes.  No palpable lymphadenopathy. Lungs: No audible wheezing or coughing. Heart: Regular rate and rhythm. Abdomen: Soft, nontender, no obvious distention. Musculoskeletal: No edema, cyanosis, or clubbing. Neuro: Alert, answering all questions appropriately. Cranial nerves grossly intact. Skin: No rashes or petechiae noted. Psych: Normal affect.   LAB RESULTS:  Lab Results  Component Value Date   NA 135 12/09/2019   K 4.2 12/09/2019   CL 101 12/09/2019    CO2 26 12/09/2019   GLUCOSE 120 (H) 12/09/2019   BUN 15 12/09/2019   CREATININE 1.19 12/09/2019   CALCIUM 8.9 12/09/2019   PROT 7.2 10/26/2019   ALBUMIN 4.1 10/26/2019   AST 18 10/26/2019   ALT 22 10/26/2019   ALKPHOS 53 10/26/2019   BILITOT 1.2 10/26/2019   GFRNONAA >60 12/09/2019   GFRAA >60 12/09/2019    Lab Results  Component Value Date   WBC 2.5 (L) 12/09/2019   NEUTROABS 1.4 (L) 12/09/2019   HGB 9.7 (L) 12/09/2019   HCT 28.0 (L) 12/09/2019   MCV 91.8 12/09/2019   PLT 205 12/09/2019     STUDIES: No results found.  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  PLAN:    1. 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  Patient has no obvious metastatic disease.  Plan is to give weekly cisplatin throughout the duration of XRT.  Continue daily XRT completing on December 25, 2019.  Patient neutropenia has improved, therefore proceed with cycle 6 of dose reduced cisplatin today. Return to clinic in 1 week for further evaluation and consideration of cycle 7. 2.  Urinary retention: Resolved.  Likely related to oral Compazine.  This has been discontinued. 3.  Nausea: Patient does not complain of this today.  Continue Zofran as needed. 4.  Thrombocytopenia: Resolved.  Proceed with dose reduced cisplatin as above. 5.  Poor appetite/dehydration: Resolved. 6.  Renal insufficiency: Improved.  Resolved. 7.  IV access: Patient discussed possible port placement with surgery this past week, but will hold off until absolutely necessary. 8.  Neutropenia: Improved.  Patient's ANC is now 1.4.  Patient expressed understanding and was in agreement with this plan. He also understands that He can call clinic at any time with any questions, concerns, or complaints.    Lloyd Huger, MD   12/09/2019 9:52 AM

## 2019-12-03 ENCOUNTER — Other Ambulatory Visit: Payer: Self-pay

## 2019-12-03 ENCOUNTER — Ambulatory Visit
Admission: RE | Admit: 2019-12-03 | Discharge: 2019-12-03 | Disposition: A | Discharge status: Home / Self Care | Payer: Medicare Other | Source: Ambulatory Visit | Attending: Radiation Oncology | Admitting: Radiation Oncology

## 2019-12-03 DIAGNOSIS — Z510 Encounter for antineoplastic radiation therapy: Secondary | ICD-10-CM | POA: Diagnosis not present

## 2019-12-07 ENCOUNTER — Other Ambulatory Visit: Payer: Self-pay

## 2019-12-07 ENCOUNTER — Inpatient Hospital Stay: Payer: Medicare Other

## 2019-12-07 ENCOUNTER — Ambulatory Visit
Admission: RE | Admit: 2019-12-07 | Discharge: 2019-12-07 | Disposition: A | Discharge status: Home / Self Care | Payer: Medicare Other | Source: Ambulatory Visit | Attending: Radiation Oncology | Admitting: Radiation Oncology

## 2019-12-07 DIAGNOSIS — Z510 Encounter for antineoplastic radiation therapy: Secondary | ICD-10-CM | POA: Diagnosis not present

## 2019-12-07 NOTE — Progress Notes (Signed)
Nutrition Follow-up:  Patient with stage IV squamous cell carcinoma of right tonsil.  Patient has restarted radiation and chemotherapy (12/23).    Met with patient in clinic following radiation today.  Patient reports nausea and one episode of vomiting over the weekend.  Appetite poor.  Mainly eating soups, whole milk, carnation breakfast essentials and water.  No pain on swallowing.    Medications: reviewed  Labs: reviewed  Anthropometrics:   Weight 233 lb 1.6 oz on 12/23  234 lb 4.8 oz on 12/17 233 lb on 12/9  Overall weight decrease 248 lb on 10/29   NUTRITION DIAGNOSIS: Predicted suboptimal energy intake continues   INTERVENTION:  Discussed option of adding protein powder to soups to increase protein.  Patient to continue nausea medications. Patient has contact information    MONITORING, EVALUATION, GOAL: Patient will consume adequate calories and protein to maintain lean muscle mass during treatment   NEXT VISIT: Jan 4th following radiation  Bryan Gomez B. Zenia Resides, [place], [place] Registered Dietitian [phone] (pager)

## 2019-12-08 ENCOUNTER — Other Ambulatory Visit: Payer: Self-pay

## 2019-12-08 ENCOUNTER — Ambulatory Visit
Admission: RE | Admit: 2019-12-08 | Discharge: 2019-12-08 | Disposition: A | Discharge status: Home / Self Care | Payer: Medicare Other | Source: Ambulatory Visit | Attending: Radiation Oncology | Admitting: Radiation Oncology

## 2019-12-08 DIAGNOSIS — Z510 Encounter for antineoplastic radiation therapy: Secondary | ICD-10-CM | POA: Diagnosis not present

## 2019-12-08 NOTE — Progress Notes (Signed)
Patient pre screened for office appointment, no questions or concerns today. Patient reminded of upcoming appointment time and date. 

## 2019-12-09 ENCOUNTER — Inpatient Hospital Stay (HOSPITAL_BASED_OUTPATIENT_CLINIC_OR_DEPARTMENT_OTHER): Payer: Medicare Other | Admitting: Oncology

## 2019-12-09 ENCOUNTER — Inpatient Hospital Stay: Payer: Medicare Other

## 2019-12-09 ENCOUNTER — Other Ambulatory Visit: Payer: Self-pay

## 2019-12-09 ENCOUNTER — Ambulatory Visit
Admission: RE | Admit: 2019-12-09 | Discharge: 2019-12-09 | Disposition: A | Discharge status: Home / Self Care | Payer: Medicare Other | Source: Ambulatory Visit | Attending: Radiation Oncology | Admitting: Radiation Oncology

## 2019-12-09 VITALS — BP 111/60 | HR 63 | Temp 97.3°F | Resp 18 | Wt 228.0 lb

## 2019-12-09 DIAGNOSIS — C099 Malignant neoplasm of tonsil, unspecified: Secondary | ICD-10-CM | POA: Diagnosis not present

## 2019-12-09 DIAGNOSIS — Z5111 Encounter for antineoplastic chemotherapy: Secondary | ICD-10-CM | POA: Diagnosis not present

## 2019-12-09 DIAGNOSIS — Z510 Encounter for antineoplastic radiation therapy: Secondary | ICD-10-CM | POA: Diagnosis not present

## 2019-12-09 LAB — CBC WITH DIFFERENTIAL/PLATELET
Abs Immature Granulocytes: 0.05 10*3/uL (ref 0.00–0.07)
Basophils Absolute: 0 10*3/uL (ref 0.0–0.1)
Basophils Relative: 0 %
Eosinophils Absolute: 0 10*3/uL (ref 0.0–0.5)
Eosinophils Relative: 1 %
HCT: 28 % — ABNORMAL LOW (ref 39.0–52.0)
Hemoglobin: 9.7 g/dL — ABNORMAL LOW (ref 13.0–17.0)
Immature Granulocytes: 2 %
Lymphocytes Relative: 19 %
Lymphs Abs: 0.5 10*3/uL — ABNORMAL LOW (ref 0.7–4.0)
MCH: 31.8 pg (ref 26.0–34.0)
MCHC: 34.6 g/dL (ref 30.0–36.0)
MCV: 91.8 fL (ref 80.0–100.0)
Monocytes Absolute: 0.5 10*3/uL (ref 0.1–1.0)
Monocytes Relative: 21 %
Neutro Abs: 1.4 10*3/uL — ABNORMAL LOW (ref 1.7–7.7)
Neutrophils Relative %: 57 %
Platelets: 205 10*3/uL (ref 150–400)
RBC: 3.05 MIL/uL — ABNORMAL LOW (ref 4.22–5.81)
RDW: 14.3 % (ref 11.5–15.5)
WBC: 2.5 10*3/uL — ABNORMAL LOW (ref 4.0–10.5)
nRBC: 0 % (ref 0.0–0.2)

## 2019-12-09 LAB — BASIC METABOLIC PANEL
Anion gap: 8 (ref 5–15)
BUN: 15 mg/dL (ref 8–23)
CO2: 26 mmol/L (ref 22–32)
Calcium: 8.9 mg/dL (ref 8.9–10.3)
Chloride: 101 mmol/L (ref 98–111)
Creatinine, Ser: 1.19 mg/dL (ref 0.61–1.24)
GFR calc Af Amer: >60 mL/min (ref >60)
GFR calc non Af Amer: >60 mL/min (ref >60)
Glucose, Bld: 120 mg/dL — ABNORMAL HIGH (ref 70–99)
Potassium: 4.2 mmol/L (ref 3.5–5.1)
Sodium: 135 mmol/L (ref 135–145)

## 2019-12-09 MED ORDER — POTASSIUM CHLORIDE 2 MEQ/ML IV SOLN
Freq: Once | INTRAVENOUS | Status: AC
  Filled 2019-12-09: qty 1000

## 2019-12-09 MED ORDER — HEPARIN SOD (PORK) LOCK FLUSH 100 UNIT/ML IV SOLN
INTRAVENOUS | Status: AC
  Filled 2019-12-09: qty 5

## 2019-12-09 MED ORDER — SODIUM CHLORIDE 0.9 % IV SOLN
Freq: Once | INTRAVENOUS | Status: AC
  Filled 2019-12-09: qty 5

## 2019-12-09 MED ORDER — SODIUM CHLORIDE 0.9 % IV SOLN
30.0000 mg/m2 | Freq: Once | INTRAVENOUS | Status: AC
  Administered 2019-12-09: 13:00:00 73 mg via INTRAVENOUS
  Filled 2019-12-09: qty 73

## 2019-12-09 MED ORDER — PALONOSETRON HCL INJECTION 0.25 MG/5ML
0.2500 mg | Freq: Once | INTRAVENOUS | Status: AC
  Administered 2019-12-09: 0.25 mg via INTRAVENOUS
  Filled 2019-12-09: qty 5

## 2019-12-09 MED ORDER — SODIUM CHLORIDE 0.9 % IV SOLN
Freq: Once | INTRAVENOUS | Status: AC
  Filled 2019-12-09: qty 250

## 2019-12-09 NOTE — Progress Notes (Signed)
Pt states nurse called yesterday for pre assessment questions. States no changes.

## 2019-12-09 NOTE — Progress Notes (Signed)
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  INTERVAL HISTORY: Patient returns to clinic today for further evaluation and consideration of cycle 7 of dose reduced cisplatin.  He continues to feel well and remains asymptomatic. He has no neurologic complaints.  He denies any recent fevers or illnesses.  He has a fair appetite and denies weight loss.  He has no chest pain, shortness of breath, cough, or hemoptysis.  He denies any nausea, vomiting, constipation, or diarrhea.  He has no urinary complaints.  Patient offers no specific complaints today.  REVIEW OF SYSTEMS:   Review of Systems  Constitutional: Negative.  Negative for fever, malaise/fatigue and weight loss.  HENT: Negative for sore throat.   Respiratory: Negative.  Negative for cough, hemoptysis and shortness of breath.   Cardiovascular: Negative.  Negative for chest pain and leg swelling.  Gastrointestinal: Negative.  Negative for abdominal pain and nausea.  Genitourinary: Negative.  Negative for dysuria.  Musculoskeletal: Negative.  Negative for back pain.  Skin: Negative.  Negative for rash.  Neurological: Negative.  Negative for dizziness, focal weakness, weakness and headaches.  Psychiatric/Behavioral: Negative.  The patient is not nervous/anxious.     As per HPI. Otherwise, a complete review of systems is negative.  PAST MEDICAL HISTORY: History reviewed. No pertinent past medical history.  PAST SURGICAL HISTORY: Past Surgical History:  Procedure Laterality Date  . ADENOIDECTOMY    . INGUINAL HERNIA REPAIR Left   . INGUINAL LYMPH NODE BIOPSY Right    removal of lymph node  . TONSILLECTOMY      FAMILY HISTORY: Family History  Problem Relation Age of Onset  .  Multiple myeloma Brother     ADVANCED DIRECTIVES (Y/N):  N  HEALTH MAINTENANCE: Social History   Tobacco Use  . Smoking status: Never Smoker  . Smokeless tobacco: Never Used  Substance Use Topics  . Alcohol use: Yes  . Drug use: Not Currently     Colonoscopy:  PAP:  Bone density:  Lipid panel:  No Known Allergies  Current Outpatient Medications  Medication Sig Dispense Refill  . chlorpheniramine (CHLOR-TRIMETON) 2 MG/5ML syrup Take 2 mg by mouth every 4 (four) hours as needed for allergies.    Marland Kitchen diclofenac sodium (VOLTAREN) 1 % GEL Voltaren 1 % topical gel  APPLY 2 GRAM TO THE AFFECTED AREA(S) BY TOPICAL ROUTE 4 TIMES PER DAY    . ondansetron (ZOFRAN) 8 MG tablet Take 1 tablet (8 mg total) by mouth 2 (two) times daily as needed. 60 tablet 1  . prochlorperazine (COMPAZINE) 10 MG tablet Take 1 tablet (10 mg total) by mouth every 6 (six) hours as needed (Nausea or vomiting). 60 tablet 1  . sennosides-docusate sodium (SENOKOT-S) 8.6-50 MG tablet Take 1 tablet by mouth daily.    Marland Kitchen SILDENAFIL CITRATE PO CHEW 1 TO 2 TABLETS BY MOUTH 30 TO 45 MINUTES BEFORE SEXUAL ACTIVITY AS NEEDED     No current facility-administered medications for this visit.   Facility-Administered Medications Ordered in Other Visits  Medication Dose Route Frequency Provider Last Rate Last Admin  . CISplatin (PLATINOL) 73 mg in sodium chloride 0.9 % 250 mL chemo infusion  30 mg/m2 (Treatment Plan Recorded) Intravenous Once Lloyd Huger, MD        OBJECTIVE:  Vitals:   12/16/19 0906  BP: (!) 115/56  Pulse: 63  Resp: 17  Temp: (!) 97.1 F (36.2 C)  SpO2: 100%     Body mass index is 28.77 kg/m.    ECOG FS:0 - Asymptomatic  General: Well-developed, well-nourished, no acute distress. Eyes: Pink conjunctiva, anicteric sclera. HEENT: Normocephalic, moist mucous membranes.  Clear oropharynx.  No palpable lymphadenopathy. Lungs: No audible wheezing or coughing. Heart: Regular rate and  rhythm. Abdomen: Soft, nontender, no obvious distention. Musculoskeletal: No edema, cyanosis, or clubbing. Neuro: Alert, answering all questions appropriately. Cranial nerves grossly intact. Skin: No rashes or petechiae noted. Psych: Normal affect.   LAB RESULTS:  Lab Results  Component Value Date   NA 136 12/16/2019   K 4.2 12/16/2019   CL 101 12/16/2019   CO2 27 12/16/2019   GLUCOSE 101 (H) 12/16/2019   BUN 13 12/16/2019   CREATININE 1.36 (H) 12/16/2019   CALCIUM 8.9 12/16/2019   PROT 7.2 10/26/2019   ALBUMIN 4.1 10/26/2019   AST 18 10/26/2019   ALT 22 10/26/2019   ALKPHOS 53 10/26/2019   BILITOT 1.2 10/26/2019   GFRNONAA 54 (L) 12/16/2019   GFRAA >60 12/16/2019    Lab Results  Component Value Date   WBC 3.7 (L) 12/16/2019   NEUTROABS 2.6 12/16/2019   HGB 9.7 (L) 12/16/2019   HCT 28.1 (L) 12/16/2019   MCV 93.0 12/16/2019   PLT 155 12/16/2019     STUDIES: No results found.  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  PLAN:    1. 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  Patient has no obvious metastatic disease.  Plan is to give weekly cisplatin throughout the duration of XRT.  Continue daily XRT completing on December 24, 2019.  Proceed with cycle 7 of dose reduced cisplatin today.  Return to clinic in 1 week for further evaluation and his eighth and final dose of treatment. 2.  Urinary retention: Resolved.  Likely related to oral Compazine.  This has been discontinued. 3.  Nausea: Patient does not complain of this today.  Continue Zofran as needed. 4.  Thrombocytopenia: Resolved.  Proceed with dose reduced cisplatin as above. 5.  Poor appetite/dehydration: Resolved. 6.  Renal insufficiency: Creatinine slightly elevated at 1.36.  Monitor. 7.  IV access: Patient discussed possible port placement with surgery this past week, but will  hold off until absolutely necessary. 8.  Neutropenia: Improved.  Patient's ANC is now within normal limits at 2.6.  Patient expressed understanding and was in agreement with this plan. He also understands that He can call clinic at any time with any questions, concerns, or complaints.    Lloyd Huger, MD   12/16/2019 2:36 PM

## 2019-12-10 ENCOUNTER — Other Ambulatory Visit: Payer: Self-pay

## 2019-12-10 ENCOUNTER — Encounter: Payer: Self-pay | Admitting: Oncology

## 2019-12-10 ENCOUNTER — Ambulatory Visit
Admission: RE | Admit: 2019-12-10 | Discharge: 2019-12-10 | Disposition: A | Discharge status: Home / Self Care | Payer: Medicare Other | Source: Ambulatory Visit | Attending: Radiation Oncology | Admitting: Radiation Oncology

## 2019-12-10 DIAGNOSIS — Z510 Encounter for antineoplastic radiation therapy: Secondary | ICD-10-CM | POA: Diagnosis not present

## 2019-12-14 ENCOUNTER — Other Ambulatory Visit: Payer: Self-pay

## 2019-12-14 ENCOUNTER — Encounter: Payer: Self-pay | Admitting: Oncology

## 2019-12-14 ENCOUNTER — Inpatient Hospital Stay: Payer: Medicare Other | Attending: Oncology

## 2019-12-14 ENCOUNTER — Ambulatory Visit
Admission: RE | Admit: 2019-12-14 | Discharge: 2019-12-14 | Disposition: A | Discharge status: Home / Self Care | Payer: Medicare Other | Source: Ambulatory Visit | Attending: Radiation Oncology | Admitting: Radiation Oncology

## 2019-12-14 DIAGNOSIS — C099 Malignant neoplasm of tonsil, unspecified: Secondary | ICD-10-CM | POA: Diagnosis present

## 2019-12-14 DIAGNOSIS — D696 Thrombocytopenia, unspecified: Secondary | ICD-10-CM | POA: Insufficient documentation

## 2019-12-14 DIAGNOSIS — Z79899 Other long term (current) drug therapy: Secondary | ICD-10-CM | POA: Insufficient documentation

## 2019-12-14 DIAGNOSIS — Z510 Encounter for antineoplastic radiation therapy: Secondary | ICD-10-CM | POA: Insufficient documentation

## 2019-12-14 DIAGNOSIS — E860 Dehydration: Secondary | ICD-10-CM | POA: Insufficient documentation

## 2019-12-14 DIAGNOSIS — Z5111 Encounter for antineoplastic chemotherapy: Secondary | ICD-10-CM | POA: Insufficient documentation

## 2019-12-14 DIAGNOSIS — N289 Disorder of kidney and ureter, unspecified: Secondary | ICD-10-CM | POA: Insufficient documentation

## 2019-12-14 NOTE — Progress Notes (Signed)
Nutrition Follow-up:  Patient with stage IV squamous cell carcinoma of right tonsil.  Patient continues with radiation and chemotherapy.    Met with patient following radiation this am.  Patient reports that has had 1 episode of vomiting.  Has been taking zofran consistently q 12 hours.  Reports that taste changes are biggest factor effecting intake.  Patient is doing baking soda, salt water rinses.  Has purchased a protein powder (vanilla) and has been trying to work with it to add to foods.  Clumped up when added to potato soup.  Patient consuming carnation breakfast essentials mixed with whole milk daily.  Intake mainly consisting of soups, carnation breakfast essentials, water, yogurt, ice cream, pasta dishes.  Denies painful swallowing.      Medications: MVI per patient and Fe, zofran, senokot, miralax  Labs: reviewed  Anthropometrics:   Weight 228 lb 12/30 decreased from 233 lb 1.6 oz on 12/23 234 lb 4.8 oz on 12/17 233 lb on 12/9 248 lb on 10/29   NUTRITION DIAGNOSIS: Predicted suboptimal energy intake continues   INTERVENTION:  Encouraged use of protein powders added to soups and liquids.  Another brand discussed. Patient to continue with zofran to help with nausea Patient will continue MVI and oral Fe.   Encouraged eating q 1-2 hours. Patient has contact information    MONITORING, EVALUATION, GOAL: Patient will consume adequate calories and protein to maintain lean muscle mass during treatment   NEXT VISIT: Jan 14th phone f/u  Bernestine Holsapple B. Zenia Resides, [place], [place] Registered Dietitian [phone] (pager)

## 2019-12-15 ENCOUNTER — Ambulatory Visit
Admission: RE | Admit: 2019-12-15 | Discharge: 2019-12-15 | Disposition: A | Discharge status: Home / Self Care | Payer: Medicare Other | Source: Ambulatory Visit | Attending: Radiation Oncology | Admitting: Radiation Oncology

## 2019-12-15 ENCOUNTER — Other Ambulatory Visit: Payer: Self-pay

## 2019-12-15 ENCOUNTER — Encounter: Payer: Self-pay | Admitting: Oncology

## 2019-12-15 ENCOUNTER — Ambulatory Visit: Payer: Medicare Other

## 2019-12-15 DIAGNOSIS — Z510 Encounter for antineoplastic radiation therapy: Secondary | ICD-10-CM | POA: Diagnosis not present

## 2019-12-15 NOTE — Progress Notes (Signed)
Patient prescreened for appointment. Patient has no concerns or questions.  

## 2019-12-16 ENCOUNTER — Ambulatory Visit
Admission: RE | Admit: 2019-12-16 | Discharge: 2019-12-16 | Disposition: A | Discharge status: Home / Self Care | Payer: Medicare Other | Source: Ambulatory Visit | Attending: Radiation Oncology | Admitting: Radiation Oncology

## 2019-12-16 ENCOUNTER — Inpatient Hospital Stay: Payer: Medicare Other

## 2019-12-16 ENCOUNTER — Inpatient Hospital Stay (HOSPITAL_BASED_OUTPATIENT_CLINIC_OR_DEPARTMENT_OTHER): Payer: Medicare Other | Admitting: Oncology

## 2019-12-16 ENCOUNTER — Other Ambulatory Visit: Payer: Self-pay

## 2019-12-16 VITALS — BP 115/56 | HR 63 | Temp 97.1°F | Resp 17 | Wt 224.1 lb

## 2019-12-16 DIAGNOSIS — C099 Malignant neoplasm of tonsil, unspecified: Secondary | ICD-10-CM

## 2019-12-16 DIAGNOSIS — Z5111 Encounter for antineoplastic chemotherapy: Secondary | ICD-10-CM | POA: Diagnosis present

## 2019-12-16 DIAGNOSIS — N289 Disorder of kidney and ureter, unspecified: Secondary | ICD-10-CM | POA: Diagnosis not present

## 2019-12-16 DIAGNOSIS — D696 Thrombocytopenia, unspecified: Secondary | ICD-10-CM | POA: Diagnosis not present

## 2019-12-16 DIAGNOSIS — Z79899 Other long term (current) drug therapy: Secondary | ICD-10-CM | POA: Diagnosis not present

## 2019-12-16 DIAGNOSIS — E860 Dehydration: Secondary | ICD-10-CM | POA: Diagnosis not present

## 2019-12-16 DIAGNOSIS — Z510 Encounter for antineoplastic radiation therapy: Secondary | ICD-10-CM | POA: Diagnosis not present

## 2019-12-16 LAB — CBC WITH DIFFERENTIAL/PLATELET
Abs Immature Granulocytes: 0.03 10*3/uL (ref 0.00–0.07)
Basophils Absolute: 0 10*3/uL (ref 0.0–0.1)
Basophils Relative: 1 %
Eosinophils Absolute: 0 10*3/uL (ref 0.0–0.5)
Eosinophils Relative: 0 %
HCT: 28.1 % — ABNORMAL LOW (ref 39.0–52.0)
Hemoglobin: 9.7 g/dL — ABNORMAL LOW (ref 13.0–17.0)
Immature Granulocytes: 1 %
Lymphocytes Relative: 13 %
Lymphs Abs: 0.5 10*3/uL — ABNORMAL LOW (ref 0.7–4.0)
MCH: 32.1 pg (ref 26.0–34.0)
MCHC: 34.5 g/dL (ref 30.0–36.0)
MCV: 93 fL (ref 80.0–100.0)
Monocytes Absolute: 0.6 10*3/uL (ref 0.1–1.0)
Monocytes Relative: 16 %
Neutro Abs: 2.6 10*3/uL (ref 1.7–7.7)
Neutrophils Relative %: 69 %
Platelets: 155 10*3/uL (ref 150–400)
RBC: 3.02 MIL/uL — ABNORMAL LOW (ref 4.22–5.81)
RDW: 15.4 % (ref 11.5–15.5)
WBC: 3.7 10*3/uL — ABNORMAL LOW (ref 4.0–10.5)
nRBC: 0 % (ref 0.0–0.2)

## 2019-12-16 LAB — BASIC METABOLIC PANEL
Anion gap: 8 (ref 5–15)
BUN: 13 mg/dL (ref 8–23)
CO2: 27 mmol/L (ref 22–32)
Calcium: 8.9 mg/dL (ref 8.9–10.3)
Chloride: 101 mmol/L (ref 98–111)
Creatinine, Ser: 1.36 mg/dL — ABNORMAL HIGH (ref 0.61–1.24)
GFR calc Af Amer: >60 mL/min (ref >60)
GFR calc non Af Amer: 54 mL/min — ABNORMAL LOW (ref >60)
Glucose, Bld: 101 mg/dL — ABNORMAL HIGH (ref 70–99)
Potassium: 4.2 mmol/L (ref 3.5–5.1)
Sodium: 136 mmol/L (ref 135–145)

## 2019-12-16 MED ORDER — SODIUM CHLORIDE 0.9 % IV SOLN
Freq: Once | INTRAVENOUS | Status: AC
  Filled 2019-12-16: qty 5

## 2019-12-16 MED ORDER — POTASSIUM CHLORIDE 2 MEQ/ML IV SOLN
Freq: Once | INTRAVENOUS | Status: AC
  Filled 2019-12-16: qty 1000

## 2019-12-16 MED ORDER — SODIUM CHLORIDE 0.9 % IV SOLN
30.0000 mg/m2 | Freq: Once | INTRAVENOUS | Status: AC
  Administered 2019-12-16: 73 mg via INTRAVENOUS
  Filled 2019-12-16: qty 73

## 2019-12-16 MED ORDER — SODIUM CHLORIDE 0.9 % IV SOLN
Freq: Once | INTRAVENOUS | Status: AC
  Filled 2019-12-16: qty 250

## 2019-12-16 MED ORDER — PALONOSETRON HCL INJECTION 0.25 MG/5ML
0.2500 mg | Freq: Once | INTRAVENOUS | Status: AC
  Administered 2019-12-16: 0.25 mg via INTRAVENOUS
  Filled 2019-12-16: qty 5

## 2019-12-16 NOTE — Progress Notes (Signed)
Pt reports some lightheadedness and dizziness. Also endorses nausea, vomiting and diarrhea.

## 2019-12-17 ENCOUNTER — Ambulatory Visit
Admission: RE | Admit: 2019-12-17 | Discharge: 2019-12-17 | Disposition: A | Discharge status: Home / Self Care | Payer: Medicare Other | Source: Ambulatory Visit | Attending: Radiation Oncology | Admitting: Radiation Oncology

## 2019-12-17 ENCOUNTER — Other Ambulatory Visit: Payer: Self-pay

## 2019-12-17 DIAGNOSIS — Z510 Encounter for antineoplastic radiation therapy: Secondary | ICD-10-CM | POA: Diagnosis not present

## 2019-12-18 ENCOUNTER — Ambulatory Visit
Admission: RE | Admit: 2019-12-18 | Discharge: 2019-12-18 | Disposition: A | Discharge status: Home / Self Care | Payer: Medicare Other | Source: Ambulatory Visit | Attending: Radiation Oncology | Admitting: Radiation Oncology

## 2019-12-18 ENCOUNTER — Other Ambulatory Visit: Payer: Self-pay

## 2019-12-18 DIAGNOSIS — Z510 Encounter for antineoplastic radiation therapy: Secondary | ICD-10-CM | POA: Diagnosis not present

## 2019-12-19 NOTE — Progress Notes (Signed)
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  INTERVAL HISTORY: Patient returns to clinic today for further evaluation and consideration of his eighth and final dose of weekly cisplatin.  He has had increased weakness and fatigue this past week and radiation has been put on the way once again.  He has poor p.o. intake, but denies any dysphagia.  He has no neurologic complaints.  He denies any recent fevers or illnesses.  He has a fair appetite and denies weight loss.  He has no chest pain, shortness of breath, cough, or hemoptysis.  He denies any nausea, vomiting, constipation, or diarrhea.  He has no urinary complaints.  Patient offers no further specific complaints today.  REVIEW OF SYSTEMS:   Review of Systems  Constitutional: Positive for malaise/fatigue. Negative for fever and weight loss.  HENT: Negative for sore throat.   Respiratory: Negative.  Negative for cough, hemoptysis and shortness of breath.   Cardiovascular: Negative.  Negative for chest pain and leg swelling.  Gastrointestinal: Negative.  Negative for abdominal pain and nausea.  Genitourinary: Negative.  Negative for dysuria.  Musculoskeletal: Negative.  Negative for back pain.  Skin: Negative.  Negative for rash.  Neurological: Positive for weakness. Negative for dizziness, focal weakness and headaches.  Psychiatric/Behavioral: Negative.  The patient is not nervous/anxious.     As per HPI. Otherwise, a complete review of systems is negative.  PAST MEDICAL HISTORY: No past medical history on file.  PAST SURGICAL HISTORY: Past Surgical History:  Procedure Laterality Date  . ADENOIDECTOMY    . INGUINAL HERNIA REPAIR Left   . INGUINAL LYMPH NODE BIOPSY Right    removal  of lymph node  . TONSILLECTOMY      FAMILY HISTORY: Family History  Problem Relation Age of Onset  . Multiple myeloma Brother     ADVANCED DIRECTIVES (Y/N):  N  HEALTH MAINTENANCE: Social History   Tobacco Use  . Smoking status: Never Smoker  . Smokeless tobacco: Never Used  Substance Use Topics  . Alcohol use: Yes  . Drug use: Not Currently     Colonoscopy:  PAP:  Bone density:  Lipid panel:  No Known Allergies  Current Outpatient Medications  Medication Sig Dispense Refill  . chlorpheniramine (CHLOR-TRIMETON) 2 MG/5ML syrup Take 2 mg by mouth every 4 (four) hours as needed for allergies.    Marland Kitchen diclofenac sodium (VOLTAREN) 1 % GEL Voltaren 1 % topical gel  APPLY 2 GRAM TO THE AFFECTED AREA(S) BY TOPICAL ROUTE 4 TIMES PER DAY    . ondansetron (ZOFRAN) 8 MG tablet Take 1 tablet (8 mg total) by mouth 2 (two) times daily as needed. 60 tablet 1  . prochlorperazine (COMPAZINE) 10 MG tablet Take 1 tablet (10 mg total) by mouth every 6 (six) hours as needed (Nausea or vomiting). 60 tablet 1  . sennosides-docusate sodium (SENOKOT-S) 8.6-50 MG tablet Take 1 tablet by mouth daily.    Marland Kitchen SILDENAFIL CITRATE PO CHEW 1 TO 2 TABLETS BY MOUTH 30 TO 45 MINUTES BEFORE SEXUAL ACTIVITY AS NEEDED     No current facility-administered medications for this visit.    OBJECTIVE: Vitals:   12/23/19 0840  BP: 109/60  Pulse: 94  Resp: 17  SpO2: 97%     Body mass index  is 28.53 kg/m.    ECOG FS:0 - Asymptomatic  General: Well-developed, well-nourished, no acute distress. Eyes: Pink conjunctiva, anicteric sclera. HEENT: Normocephalic, moist mucous membranes.  No palpable lymphadenopathy. Lungs: No audible wheezing or coughing. Heart: Regular rate and rhythm. Abdomen: Soft, nontender, no obvious distention. Musculoskeletal: No edema, cyanosis, or clubbing. Neuro: Alert, answering all questions appropriately. Cranial nerves grossly intact. Skin: No rashes or petechiae noted. Psych: Normal  affect.   LAB RESULTS:  Lab Results  Component Value Date   NA 137 12/23/2019   K 4.7 12/23/2019   CL 102 12/23/2019   CO2 26 12/23/2019   GLUCOSE 104 (H) 12/23/2019   BUN 21 12/23/2019   CREATININE 1.39 (H) 12/23/2019   CALCIUM 9.2 12/23/2019   PROT 7.2 10/26/2019   ALBUMIN 4.1 10/26/2019   AST 18 10/26/2019   ALT 22 10/26/2019   ALKPHOS 53 10/26/2019   BILITOT 1.2 10/26/2019   GFRNONAA 53 (L) 12/23/2019   GFRAA >60 12/23/2019    Lab Results  Component Value Date   WBC 3.9 (L) 12/23/2019   NEUTROABS 2.8 12/23/2019   HGB 9.1 (L) 12/23/2019   HCT 26.3 (L) 12/23/2019   MCV 94.3 12/23/2019   PLT 126 (L) 12/23/2019     STUDIES: No results found.  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  PLAN:    1. 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  Patient has no obvious metastatic disease.  Plan is to give weekly cisplatin throughout the duration of XRT.  XRT has been delayed and he will now complete treatment on December 31, 2019.  Given his increased weakness and fatigue, will discontinue cisplatin altogether and patient will receive IV fluids today.  Return to clinic in 2 and 5 weeks for further evaluation, laboratory work, and consideration of additional IV fluids. 2.  Urinary retention: Resolved.  Likely related to oral Compazine.  This has been discontinued. 3.  Nausea: Patient does not complain of this today.  Continue Zofran as needed. 4.  Thrombocytopenia: Mild.  Discontinue treatment as above. 5.  Poor appetite/dehydration: IV fluids as above.  Discontinue cisplatin. 6.  Renal insufficiency: Creatinine is trended up slightly, monitor. 7.  IV access: Patient discussed possible port placement with surgery this past week, but will hold off until absolutely necessary. 8.  Neutropenia: Resolved.  Patient expressed understanding and was in  agreement with this plan. He also understands that He can call clinic at any time with any questions, concerns, or complaints.    Lloyd Huger, MD   12/23/2019 10:04 AM

## 2019-12-21 ENCOUNTER — Ambulatory Visit
Admission: RE | Admit: 2019-12-21 | Discharge: 2019-12-21 | Disposition: A | Discharge status: Home / Self Care | Payer: Medicare Other | Source: Ambulatory Visit | Attending: Radiation Oncology | Admitting: Radiation Oncology

## 2019-12-21 ENCOUNTER — Other Ambulatory Visit: Payer: Self-pay

## 2019-12-21 ENCOUNTER — Ambulatory Visit: Payer: Medicare Other

## 2019-12-21 DIAGNOSIS — Z510 Encounter for antineoplastic radiation therapy: Secondary | ICD-10-CM | POA: Diagnosis not present

## 2019-12-22 ENCOUNTER — Encounter: Payer: Self-pay | Admitting: Radiation Oncology

## 2019-12-22 ENCOUNTER — Ambulatory Visit: Payer: Medicare Other

## 2019-12-22 ENCOUNTER — Ambulatory Visit: Admission: RE | Admit: 2019-12-22 | Payer: Medicare Other | Source: Ambulatory Visit

## 2019-12-22 ENCOUNTER — Other Ambulatory Visit: Payer: Self-pay

## 2019-12-22 NOTE — Progress Notes (Signed)
Patient pre screened for office appointment, no questions or concerns today. Patient reminded of upcoming appointment time and date. 

## 2019-12-23 ENCOUNTER — Ambulatory Visit: Payer: Medicare Other

## 2019-12-23 ENCOUNTER — Inpatient Hospital Stay (HOSPITAL_BASED_OUTPATIENT_CLINIC_OR_DEPARTMENT_OTHER): Payer: Medicare Other | Admitting: Oncology

## 2019-12-23 ENCOUNTER — Inpatient Hospital Stay: Payer: Medicare Other

## 2019-12-23 ENCOUNTER — Other Ambulatory Visit: Payer: Self-pay

## 2019-12-23 VITALS — BP 115/68 | HR 58 | Resp 18

## 2019-12-23 VITALS — BP 109/60 | HR 94 | Resp 17 | Wt 222.2 lb

## 2019-12-23 DIAGNOSIS — Z5111 Encounter for antineoplastic chemotherapy: Secondary | ICD-10-CM | POA: Diagnosis not present

## 2019-12-23 DIAGNOSIS — C099 Malignant neoplasm of tonsil, unspecified: Secondary | ICD-10-CM

## 2019-12-23 LAB — BASIC METABOLIC PANEL
Anion gap: 9 (ref 5–15)
BUN: 21 mg/dL (ref 8–23)
CO2: 26 mmol/L (ref 22–32)
Calcium: 9.2 mg/dL (ref 8.9–10.3)
Chloride: 102 mmol/L (ref 98–111)
Creatinine, Ser: 1.39 mg/dL — ABNORMAL HIGH (ref 0.61–1.24)
GFR calc Af Amer: >60 mL/min (ref >60)
GFR calc non Af Amer: 53 mL/min — ABNORMAL LOW (ref >60)
Glucose, Bld: 104 mg/dL — ABNORMAL HIGH (ref 70–99)
Potassium: 4.7 mmol/L (ref 3.5–5.1)
Sodium: 137 mmol/L (ref 135–145)

## 2019-12-23 LAB — CBC WITH DIFFERENTIAL/PLATELET
Abs Immature Granulocytes: 0.02 10*3/uL (ref 0.00–0.07)
Basophils Absolute: 0 10*3/uL (ref 0.0–0.1)
Basophils Relative: 0 %
Eosinophils Absolute: 0 10*3/uL (ref 0.0–0.5)
Eosinophils Relative: 1 %
HCT: 26.3 % — ABNORMAL LOW (ref 39.0–52.0)
Hemoglobin: 9.1 g/dL — ABNORMAL LOW (ref 13.0–17.0)
Immature Granulocytes: 1 %
Lymphocytes Relative: 12 %
Lymphs Abs: 0.5 10*3/uL — ABNORMAL LOW (ref 0.7–4.0)
MCH: 32.6 pg (ref 26.0–34.0)
MCHC: 34.6 g/dL (ref 30.0–36.0)
MCV: 94.3 fL (ref 80.0–100.0)
Monocytes Absolute: 0.5 10*3/uL (ref 0.1–1.0)
Monocytes Relative: 13 %
Neutro Abs: 2.8 10*3/uL (ref 1.7–7.7)
Neutrophils Relative %: 73 %
Platelets: 126 10*3/uL — ABNORMAL LOW (ref 150–400)
RBC: 2.79 MIL/uL — ABNORMAL LOW (ref 4.22–5.81)
RDW: 16.2 % — ABNORMAL HIGH (ref 11.5–15.5)
WBC: 3.9 10*3/uL — ABNORMAL LOW (ref 4.0–10.5)
nRBC: 0 % (ref 0.0–0.2)

## 2019-12-23 MED ORDER — SODIUM CHLORIDE 0.9 % IV SOLN
Freq: Once | INTRAVENOUS | Status: AC
  Filled 2019-12-23: qty 250

## 2019-12-23 NOTE — Progress Notes (Signed)
Pt here for follow up. Reports still having difficulty with eating and drinking, also endorses lightheadedness. Reports over the last few days has been shaky. Denies pain.

## 2019-12-24 ENCOUNTER — Ambulatory Visit: Payer: Medicare Other

## 2019-12-24 ENCOUNTER — Inpatient Hospital Stay: Payer: Medicare Other

## 2019-12-24 NOTE — Progress Notes (Signed)
Nutrition Follow-up:  Patient with stage IV squamous cell carcinoma of right tonsil.  Patient with radiation on hold until 1/19 to be finished on 1/21.  Last chemotherapy treatment held was given IV fluids yesterday   Spoke with patient via phone for nutrition follow-up.  Patient reports appetite is a little bit better. Drinking carnation instant breakfast with genepro added TID.  Ate oatmeal this am.  Continues with soups, yogurt, pasta dishes, ice cream, whole milk.  Added genepro to miralax drink as well.  Nausea is better.    Medications: reviewed  Labs: reviewed  Anthropometrics:   Weight 222 lb on 1/13 decreased from 228 lb on 12/30  248 lb on 10/29  10% weight loss in the last 2 1/2 months, significant  NUTRITION DIAGNOSIS: Predicted suboptimal energy intake continues   INTERVENTION:  Discussed additional ways to use protein powder for added protein Continue carnation instant breakfast TID  Encouraged patient to call clinic if needs fluids    MONITORING, EVALUATION, GOAL: Patient will consume adequate calories and protein to maintain lean muscle mass during treatment   NEXT VISIT: phone f/u Feb 4  Bryan Gomez, [place], [place] Registered Dietitian [phone] (pager)

## 2019-12-25 ENCOUNTER — Ambulatory Visit: Payer: Medicare Other

## 2019-12-28 ENCOUNTER — Ambulatory Visit: Payer: Medicare Other

## 2019-12-29 ENCOUNTER — Other Ambulatory Visit: Payer: Self-pay

## 2019-12-29 ENCOUNTER — Ambulatory Visit
Admission: RE | Admit: 2019-12-29 | Discharge: 2019-12-29 | Disposition: A | Discharge status: Home / Self Care | Payer: Medicare Other | Source: Ambulatory Visit | Attending: Radiation Oncology | Admitting: Radiation Oncology

## 2019-12-29 DIAGNOSIS — Z510 Encounter for antineoplastic radiation therapy: Secondary | ICD-10-CM | POA: Diagnosis not present

## 2019-12-30 ENCOUNTER — Other Ambulatory Visit: Payer: Self-pay

## 2019-12-30 ENCOUNTER — Ambulatory Visit
Admission: RE | Admit: 2019-12-30 | Discharge: 2019-12-30 | Disposition: A | Discharge status: Home / Self Care | Payer: Medicare Other | Source: Ambulatory Visit | Attending: Radiation Oncology | Admitting: Radiation Oncology

## 2019-12-30 ENCOUNTER — Ambulatory Visit: Payer: Medicare Other

## 2019-12-30 DIAGNOSIS — Z510 Encounter for antineoplastic radiation therapy: Secondary | ICD-10-CM | POA: Diagnosis not present

## 2019-12-31 ENCOUNTER — Ambulatory Visit
Admission: RE | Admit: 2019-12-31 | Discharge: 2019-12-31 | Disposition: A | Discharge status: Home / Self Care | Payer: Medicare Other | Source: Ambulatory Visit | Attending: Radiation Oncology | Admitting: Radiation Oncology

## 2019-12-31 ENCOUNTER — Other Ambulatory Visit: Payer: Self-pay

## 2019-12-31 DIAGNOSIS — Z510 Encounter for antineoplastic radiation therapy: Secondary | ICD-10-CM | POA: Diagnosis not present

## 2020-01-02 ENCOUNTER — Encounter: Payer: Self-pay | Admitting: Oncology

## 2020-01-02 NOTE — Progress Notes (Signed)
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  INTERVAL HISTORY: Patient returns to clinic today for 3 repeat laboratory work and further evaluation.  He continues to have a dry mouth and a sore throat, but only recently completed his XRT.  His weakness and fatigue are improving.  He has no neurologic complaints.  He denies any recent fevers or illnesses.  He has a fair appetite and denies weight loss.  He has no chest pain, shortness of breath, cough, or hemoptysis.  He denies any nausea, vomiting, constipation, or diarrhea.  He has no urinary complaints.  Patient offers no further specific complaints today.  REVIEW OF SYSTEMS:   Review of Systems  Constitutional: Positive for malaise/fatigue. Negative for fever and weight loss.  HENT: Positive for sore throat.   Respiratory: Negative.  Negative for cough, hemoptysis and shortness of breath.   Cardiovascular: Negative.  Negative for chest pain and leg swelling.  Gastrointestinal: Negative.  Negative for abdominal pain and nausea.  Genitourinary: Negative.  Negative for dysuria.  Musculoskeletal: Negative.  Negative for back pain.  Skin: Negative.  Negative for rash.  Neurological: Positive for weakness. Negative for dizziness, focal weakness and headaches.  Psychiatric/Behavioral: Negative.  The patient is not nervous/anxious.     As per HPI. Otherwise, a complete review of systems is negative.  PAST MEDICAL HISTORY: No past medical history on file.  PAST SURGICAL HISTORY: Past Surgical History:  Procedure Laterality Date  . ADENOIDECTOMY    . INGUINAL HERNIA REPAIR Left   . INGUINAL LYMPH NODE BIOPSY Right    removal of lymph node  . TONSILLECTOMY      FAMILY HISTORY: Family  History  Problem Relation Age of Onset  . Multiple myeloma Brother     ADVANCED DIRECTIVES (Y/N):  N  HEALTH MAINTENANCE: Social History   Tobacco Use  . Smoking status: Never Smoker  . Smokeless tobacco: Never Used  Substance Use Topics  . Alcohol use: Yes  . Drug use: Not Currently     Colonoscopy:  PAP:  Bone density:  Lipid panel:  No Known Allergies  Current Outpatient Medications  Medication Sig Dispense Refill  . chlorpheniramine (CHLOR-TRIMETON) 2 MG/5ML syrup Take 2 mg by mouth every 4 (four) hours as needed for allergies.    Marland Kitchen diclofenac sodium (VOLTAREN) 1 % GEL Voltaren 1 % topical gel  APPLY 2 GRAM TO THE AFFECTED AREA(S) BY TOPICAL ROUTE 4 TIMES PER DAY    . ondansetron (ZOFRAN) 8 MG tablet Take 1 tablet (8 mg total) by mouth 2 (two) times daily as needed. 60 tablet 1  . prochlorperazine (COMPAZINE) 10 MG tablet Take 1 tablet (10 mg total) by mouth every 6 (six) hours as needed (Nausea or vomiting). 60 tablet 1  . sennosides-docusate sodium (SENOKOT-S) 8.6-50 MG tablet Take 1 tablet by mouth daily.    Marland Kitchen SILDENAFIL CITRATE PO CHEW 1 TO 2 TABLETS BY MOUTH 30 TO 45 MINUTES BEFORE SEXUAL ACTIVITY AS NEEDED     No current facility-administered medications for this visit.    OBJECTIVE: Vitals:   01/06/20 0940  BP: (!) 107/53  Pulse: 65  Temp: (!) 97 F (36.1 C)     Body mass index is 28.37 kg/m.    ECOG FS:0 - Asymptomatic  General: Well-developed, well-nourished, no acute distress. Eyes: Pink conjunctiva, anicteric sclera. HEENT: Normocephalic, moist mucous membranes.  No palpable lymphadenopathy. Lungs: No audible wheezing or coughing. Heart: Regular rate and rhythm. Abdomen: Soft, nontender, no obvious distention. Musculoskeletal: No edema, cyanosis, or clubbing. Neuro: Alert, answering all questions appropriately. Cranial nerves grossly intact. Skin: No rashes or petechiae noted. Psych: Normal affect.   LAB RESULTS:  Lab Results  Component  Value Date   NA 140 01/06/2020   K 4.3 01/06/2020   CL 106 01/06/2020   CO2 27 01/06/2020   GLUCOSE 100 (H) 01/06/2020   BUN 16 01/06/2020   CREATININE 1.37 (H) 01/06/2020   CALCIUM 9.2 01/06/2020   PROT 7.2 10/26/2019   ALBUMIN 4.1 10/26/2019   AST 18 10/26/2019   ALT 22 10/26/2019   ALKPHOS 53 10/26/2019   BILITOT 1.2 10/26/2019   GFRNONAA 54 (L) 01/06/2020   GFRAA >60 01/06/2020    Lab Results  Component Value Date   WBC 2.5 (L) 01/06/2020   NEUTROABS 1.7 01/06/2020   HGB 8.9 (L) 01/06/2020   HCT 26.2 (L) 01/06/2020   MCV 98.5 01/06/2020   PLT 86 (L) 01/06/2020     STUDIES: No results found.  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  PLAN:    1. 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  Patient has no obvious metastatic disease.  Patient received his seventh and final dose of cisplatin on 12/16/2019.  XRT was completed approximately 12/31/2019.  No intervention is needed at this time.  Patient does not wish to pursue IV fluids today.  Return to clinic in 8 weeks with repeat imaging using PET scan and further evaluation.   2.  Urinary retention: Resolved.  Likely related to oral Compazine.  This has been discontinued. 3.  Nausea: Patient does not complain of this today.  Continue Zofran as needed. 4.  Thrombocytopenia: Platelet count has trended down to 86.  He has now completed with treatments.  Monitor. 5.  Poor appetite/dehydration: Improved.  Patient declined IV fluids today. 6.  Renal insufficiency: Creatinine remains mildly elevated, but essentially unchanged.  Monitor. 7.  IV access: Patient discussed possible port placement with surgery this past week, but will hold off until absolutely necessary. 8.  Neutropenia: White blood cell count has trended down, monitor.  Patient expressed understanding and was in agreement with this plan. He  also understands that He can call clinic at any time with any questions, concerns, or complaints.    Lloyd Huger, MD   01/07/2020 2:38 PM

## 2020-01-05 ENCOUNTER — Other Ambulatory Visit: Payer: Self-pay

## 2020-01-05 NOTE — Progress Notes (Signed)
Patient pre screened for office appointment, no questions or concerns today. Patient reminded of upcoming appointment time and date. 

## 2020-01-06 ENCOUNTER — Inpatient Hospital Stay (HOSPITAL_BASED_OUTPATIENT_CLINIC_OR_DEPARTMENT_OTHER): Payer: Medicare Other | Admitting: Oncology

## 2020-01-06 ENCOUNTER — Inpatient Hospital Stay: Payer: Medicare Other

## 2020-01-06 ENCOUNTER — Other Ambulatory Visit: Payer: Self-pay

## 2020-01-06 VITALS — BP 107/53 | HR 65 | Temp 97.0°F | Wt 221.0 lb

## 2020-01-06 DIAGNOSIS — C099 Malignant neoplasm of tonsil, unspecified: Secondary | ICD-10-CM | POA: Diagnosis not present

## 2020-01-06 DIAGNOSIS — Z5111 Encounter for antineoplastic chemotherapy: Secondary | ICD-10-CM | POA: Diagnosis not present

## 2020-01-06 LAB — CBC WITH DIFFERENTIAL/PLATELET
Abs Immature Granulocytes: 0 10*3/uL (ref 0.00–0.07)
Basophils Absolute: 0 10*3/uL (ref 0.0–0.1)
Basophils Relative: 1 %
Eosinophils Absolute: 0.1 10*3/uL (ref 0.0–0.5)
Eosinophils Relative: 4 %
HCT: 26.2 % — ABNORMAL LOW (ref 39.0–52.0)
Hemoglobin: 8.9 g/dL — ABNORMAL LOW (ref 13.0–17.0)
Immature Granulocytes: 0 %
Lymphocytes Relative: 15 %
Lymphs Abs: 0.4 10*3/uL — ABNORMAL LOW (ref 0.7–4.0)
MCH: 33.5 pg (ref 26.0–34.0)
MCHC: 34 g/dL (ref 30.0–36.0)
MCV: 98.5 fL (ref 80.0–100.0)
Monocytes Absolute: 0.3 10*3/uL (ref 0.1–1.0)
Monocytes Relative: 13 %
Neutro Abs: 1.7 10*3/uL (ref 1.7–7.7)
Neutrophils Relative %: 67 %
Platelets: 86 10*3/uL — ABNORMAL LOW (ref 150–400)
RBC: 2.66 MIL/uL — ABNORMAL LOW (ref 4.22–5.81)
RDW: 17.5 % — ABNORMAL HIGH (ref 11.5–15.5)
WBC: 2.5 10*3/uL — ABNORMAL LOW (ref 4.0–10.5)
nRBC: 0 % (ref 0.0–0.2)

## 2020-01-06 LAB — BASIC METABOLIC PANEL
Anion gap: 7 (ref 5–15)
BUN: 16 mg/dL (ref 8–23)
CO2: 27 mmol/L (ref 22–32)
Calcium: 9.2 mg/dL (ref 8.9–10.3)
Chloride: 106 mmol/L (ref 98–111)
Creatinine, Ser: 1.37 mg/dL — ABNORMAL HIGH (ref 0.61–1.24)
GFR calc Af Amer: >60 mL/min (ref >60)
GFR calc non Af Amer: 54 mL/min — ABNORMAL LOW (ref >60)
Glucose, Bld: 100 mg/dL — ABNORMAL HIGH (ref 70–99)
Potassium: 4.3 mmol/L (ref 3.5–5.1)
Sodium: 140 mmol/L (ref 135–145)

## 2020-01-14 ENCOUNTER — Inpatient Hospital Stay: Payer: Medicare Other | Attending: Oncology

## 2020-01-14 NOTE — Progress Notes (Signed)
Nutrition Follow-up:  Patient with stage IV squamous cell carcinoma of right tonsil.  Patient has completed radiation (1/21) and chemotherapy (12/16/19).  Planning PET on 3/24.    Spoke with patient via phone for nutrition follow-up.  Patient reports that he is eating better.  Still does not have any taste.  No mouth pain or soreness on swallowing.  Reports dry mouth but just typically in the am not during the day.  Reports that he has been eating oatmeal for breakfast along with carnation breakfast essentials and protein powder.  Lunch and dinner are soups and pasta dishes (ravioli, spaghetti). Meats are not tasting well so utilizing protein powder at this time.      Medications: reviewed  Labs: reviewed  Anthropometrics:   Weight 221 lb on 1/27 slight decrease from 222 lb on 1/13.    228 lb on 12/30 248 lb on 10/29  Patient reports that he has gained 4 lb (measured on home scale)  NUTRITION DIAGNOSIS: Predicted suboptimal energy intake continues    INTERVENTION:  Patient to continue consuming high calorie, high protein foods Continue carnation breakfast essentials shake BID Patient to contact RD if needed in the future    NEXT VISIT: no follow-up.  Patient to contact RD if needed  Bryan Gomez B. Zenia Resides, [place], [place] Registered Dietitian [phone] (pager)

## 2020-01-22 ENCOUNTER — Other Ambulatory Visit: Payer: Self-pay

## 2020-01-25 ENCOUNTER — Ambulatory Visit
Admission: RE | Admit: 2020-01-25 | Discharge: 2020-01-25 | Disposition: A | Discharge status: Home / Self Care | Payer: Medicare Other | Source: Ambulatory Visit | Attending: Radiation Oncology | Admitting: Radiation Oncology

## 2020-01-25 ENCOUNTER — Other Ambulatory Visit: Payer: Self-pay

## 2020-01-25 ENCOUNTER — Encounter: Payer: Self-pay | Admitting: Radiation Oncology

## 2020-01-25 VITALS — BP 116/59 | HR 77 | Temp 98.5°F | Resp 16 | Wt 220.9 lb

## 2020-01-25 DIAGNOSIS — Z9221 Personal history of antineoplastic chemotherapy: Secondary | ICD-10-CM | POA: Diagnosis not present

## 2020-01-25 DIAGNOSIS — Z923 Personal history of irradiation: Secondary | ICD-10-CM | POA: Diagnosis not present

## 2020-01-25 DIAGNOSIS — C099 Malignant neoplasm of tonsil, unspecified: Secondary | ICD-10-CM | POA: Diagnosis not present

## 2020-01-25 NOTE — Progress Notes (Signed)
Radiation Oncology Follow up Note  Name: Bryan Gomez   Date:   01/25/2020 MRN:  [id number] DOB: [birth year]    This 64 y.o. male presents to the clinic today for 1 month follow-up status post concurrent chemoradiation therapy for stage IVa squamous cell carcinoma the right tonsil.  REFERRING PROVIDER: Albina Billet, MD  HPI: Patient is a 64 year old male now seen at 1 month having completed concurrent chemoradiation therapy for stage IV (T2 N2 M0) squamous cell carcinoma of the right tonsil.  Seen today in routine follow-up he is doing well specifically denies any dysphagia head and neck pain.  His mouth is somewhat dry he also complains of lack of taste which we would expect..  Patient has a PET scheduled for next month.  COMPLICATIONS OF TREATMENT: none  FOLLOW UP COMPLIANCE: keeps appointments   PHYSICAL EXAM:  BP (!) 116/59   Pulse 77   Temp 98.5 F (36.9 C) (Tympanic)   Resp 16   Wt 220 lb 14.4 oz (100.2 kg)   BMI 28.36 kg/m  No evidence of cervical or supraclavicular adenopathy is appreciated.  Well-developed well-nourished patient in NAD. HEENT reveals PERLA, EOMI, discs not visualized.  Oral cavity is clear. No oral mucosal lesions are identified. Neck is clear without evidence of cervical or supraclavicular adenopathy. Lungs are clear to A&P. Cardiac examination is essentially unremarkable with regular rate and rhythm without murmur rub or thrill. Abdomen is benign with no organomegaly or masses noted. Motor sensory and DTR levels are equal and symmetric in the upper and lower extremities. Cranial nerves II through XII are grossly intact. Proprioception is intact. No peripheral adenopathy or edema is identified. No motor or sensory levels are noted. Crude visual fields are within normal range.  RADIOLOGY RESULTS: We will review PET/CT when it becomes available.  PLAN: Present time I have asked him to reestablish care with ENT.  He will also have a PET scan which I will review  in March.  I am otherwise pleased with his overall progress have okayed him to go ahead with Covid vaccine for both him and his wife which they do qualify for.  I have asked to see him back in 4 months for follow-up.  Patient knows to call with any concerns.  I would like to take this opportunity to thank you for allowing me to participate in the care of your patient.Noreene Filbert, MD

## 2020-01-27 ENCOUNTER — Ambulatory Visit: Payer: Medicare Other

## 2020-01-27 ENCOUNTER — Ambulatory Visit: Payer: Medicare Other | Admitting: Oncology

## 2020-01-27 ENCOUNTER — Other Ambulatory Visit: Payer: Medicare Other

## 2020-03-02 ENCOUNTER — Inpatient Hospital Stay: Payer: Medicare Other | Attending: Oncology

## 2020-03-02 ENCOUNTER — Other Ambulatory Visit: Payer: Self-pay

## 2020-03-02 ENCOUNTER — Encounter
Admission: RE | Admit: 2020-03-02 | Discharge: 2020-03-02 | Disposition: A | Discharge status: Home / Self Care | Payer: Medicare Other | Source: Ambulatory Visit | Attending: Oncology | Admitting: Oncology

## 2020-03-02 DIAGNOSIS — C099 Malignant neoplasm of tonsil, unspecified: Secondary | ICD-10-CM

## 2020-03-02 DIAGNOSIS — N200 Calculus of kidney: Secondary | ICD-10-CM | POA: Diagnosis not present

## 2020-03-02 DIAGNOSIS — I2510 Atherosclerotic heart disease of native coronary artery without angina pectoris: Secondary | ICD-10-CM | POA: Insufficient documentation

## 2020-03-02 DIAGNOSIS — D696 Thrombocytopenia, unspecified: Secondary | ICD-10-CM | POA: Insufficient documentation

## 2020-03-02 DIAGNOSIS — Z79899 Other long term (current) drug therapy: Secondary | ICD-10-CM | POA: Insufficient documentation

## 2020-03-02 DIAGNOSIS — I700 Atherosclerosis of aorta: Secondary | ICD-10-CM | POA: Insufficient documentation

## 2020-03-02 DIAGNOSIS — D649 Anemia, unspecified: Secondary | ICD-10-CM | POA: Insufficient documentation

## 2020-03-02 LAB — BASIC METABOLIC PANEL
Anion gap: 7 (ref 5–15)
BUN: 17 mg/dL (ref 8–23)
CO2: 27 mmol/L (ref 22–32)
Calcium: 9.1 mg/dL (ref 8.9–10.3)
Chloride: 105 mmol/L (ref 98–111)
Creatinine, Ser: 1.04 mg/dL (ref 0.61–1.24)
GFR calc Af Amer: >60 mL/min (ref >60)
GFR calc non Af Amer: >60 mL/min (ref >60)
Glucose, Bld: 103 mg/dL — ABNORMAL HIGH (ref 70–99)
Potassium: 4.3 mmol/L (ref 3.5–5.1)
Sodium: 139 mmol/L (ref 135–145)

## 2020-03-02 LAB — CBC WITH DIFFERENTIAL/PLATELET
Abs Immature Granulocytes: 0.01 10*3/uL (ref 0.00–0.07)
Basophils Absolute: 0 10*3/uL (ref 0.0–0.1)
Basophils Relative: 1 %
Eosinophils Absolute: 0.1 10*3/uL (ref 0.0–0.5)
Eosinophils Relative: 3 %
HCT: 33.5 % — ABNORMAL LOW (ref 39.0–52.0)
Hemoglobin: 11.8 g/dL — ABNORMAL LOW (ref 13.0–17.0)
Immature Granulocytes: 0 %
Lymphocytes Relative: 17 %
Lymphs Abs: 0.7 10*3/uL (ref 0.7–4.0)
MCH: 34.5 pg — ABNORMAL HIGH (ref 26.0–34.0)
MCHC: 35.2 g/dL (ref 30.0–36.0)
MCV: 98 fL (ref 80.0–100.0)
Monocytes Absolute: 0.4 10*3/uL (ref 0.1–1.0)
Monocytes Relative: 10 %
Neutro Abs: 2.9 10*3/uL (ref 1.7–7.7)
Neutrophils Relative %: 69 %
Platelets: 113 10*3/uL — ABNORMAL LOW (ref 150–400)
RBC: 3.42 MIL/uL — ABNORMAL LOW (ref 4.22–5.81)
RDW: 11.5 % (ref 11.5–15.5)
WBC: 4.2 10*3/uL (ref 4.0–10.5)
nRBC: 0 % (ref 0.0–0.2)

## 2020-03-02 LAB — GLUCOSE, CAPILLARY: Glucose-Capillary: 83 mg/dL (ref 70–99)

## 2020-03-02 MED ORDER — FLUDEOXYGLUCOSE F - 18 (FDG) INJECTION
11.4000 | Freq: Once | INTRAVENOUS | Status: AC | PRN
  Administered 2020-03-02: 11.79 via INTRAVENOUS

## 2020-03-05 NOTE — Progress Notes (Signed)
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  INTERVAL HISTORY: Patient returns to clinic today for further evaluation and discussion of his imaging results.  He currently feels well and is asymptomatic.  He does not complain of weakness or fatigue.  He does not complain of dry mouth today.  He has no neurologic complaints.  He denies any recent fevers or illnesses.  He has a fair appetite and denies weight loss.  He has no chest pain, shortness of breath, cough, or hemoptysis.  He denies any nausea, vomiting, constipation, or diarrhea.  He has no urinary complaints.  Patient offers no specific complaints today.  REVIEW OF SYSTEMS:   Review of Systems  Constitutional: Negative for fever, malaise/fatigue and weight loss.  HENT: Negative.  Negative for sore throat.   Respiratory: Negative.  Negative for cough, hemoptysis and shortness of breath.   Cardiovascular: Negative.  Negative for chest pain and leg swelling.  Gastrointestinal: Negative.  Negative for abdominal pain and nausea.  Genitourinary: Negative.  Negative for dysuria.  Musculoskeletal: Negative.  Negative for back pain.  Skin: Negative.  Negative for rash.  Neurological: Negative.  Negative for dizziness, focal weakness, weakness and headaches.  Psychiatric/Behavioral: Negative.  The patient is not nervous/anxious.     As per HPI. Otherwise, a complete review of systems is negative.  PAST MEDICAL HISTORY: History reviewed. No pertinent past medical history.  PAST SURGICAL HISTORY: Past Surgical History:  Procedure Laterality Date  . ADENOIDECTOMY    . INGUINAL HERNIA REPAIR Left   . INGUINAL LYMPH NODE BIOPSY Right     removal of lymph node  . TONSILLECTOMY      FAMILY HISTORY: Family History  Problem Relation Age of Onset  . Multiple myeloma Brother     ADVANCED DIRECTIVES (Y/N):  N  HEALTH MAINTENANCE: Social History   Tobacco Use  . Smoking status: Never Smoker  . Smokeless tobacco: Never Used  Substance Use Topics  . Alcohol use: Not Currently  . Drug use: Not Currently     Colonoscopy:  PAP:  Bone density:  Lipid panel:  No Known Allergies  Current Outpatient Medications  Medication Sig Dispense Refill  . chlorpheniramine (CHLOR-TRIMETON) 2 MG/5ML syrup Take 2 mg by mouth every 4 (four) hours as needed for allergies.    Marland Kitchen sennosides-docusate sodium (SENOKOT-S) 8.6-50 MG tablet Take 1 tablet by mouth daily.    Marland Kitchen SILDENAFIL CITRATE PO CHEW 1 TO 2 TABLETS BY MOUTH 30 TO 45 MINUTES BEFORE SEXUAL ACTIVITY AS NEEDED    . diclofenac sodium (VOLTAREN) 1 % GEL Voltaren 1 % topical gel  APPLY 2 GRAM TO THE AFFECTED AREA(S) BY TOPICAL ROUTE 4 TIMES PER DAY    . ondansetron (ZOFRAN) 8 MG tablet Take 1 tablet (8 mg total) by mouth 2 (two) times daily as needed. (Patient not taking: Reported on 03/07/2020) 60 tablet 1   No current facility-administered medications for this visit.    OBJECTIVE: Vitals:   03/07/20 1629  BP: 116/69  Pulse: (!) 58  Resp: 20  Temp: (!) 96.7 F (35.9 C)  SpO2: 100%     Body mass index is 28.18 kg/m.    ECOG FS:0 - Asymptomatic  General: Well-developed, well-nourished,  no acute distress. Eyes: Pink conjunctiva, anicteric sclera. HEENT: Normocephalic, moist mucous membranes.  No palpable lymphadenopathy. Lungs: No audible wheezing or coughing. Heart: Regular rate and rhythm. Abdomen: Soft, nontender, no obvious distention. Musculoskeletal: No edema, cyanosis, or clubbing. Neuro: Alert, answering all questions appropriately. Cranial nerves grossly intact. Skin: No rashes or petechiae noted. Psych: Normal affect.   LAB RESULTS:  Lab Results    Component Value Date   NA 139 03/02/2020   K 4.3 03/02/2020   CL 105 03/02/2020   CO2 27 03/02/2020   GLUCOSE 103 (H) 03/02/2020   BUN 17 03/02/2020   CREATININE 1.04 03/02/2020   CALCIUM 9.1 03/02/2020   PROT 7.2 10/26/2019   ALBUMIN 4.1 10/26/2019   AST 18 10/26/2019   ALT 22 10/26/2019   ALKPHOS 53 10/26/2019   BILITOT 1.2 10/26/2019   GFRNONAA >60 03/02/2020   GFRAA >60 03/02/2020    Lab Results  Component Value Date   WBC 4.2 03/02/2020   NEUTROABS 2.9 03/02/2020   HGB 11.8 (L) 03/02/2020   HCT 33.5 (L) 03/02/2020   MCV 98.0 03/02/2020   PLT 113 (L) 03/02/2020     STUDIES: NM PET Image Restag (PS) Skull Base To Thigh  Result Date: 03/02/2020 CLINICAL DATA:  Subsequent treatment strategy for right tonsillar squamous cell carcinoma. Most recent radiation therapy and chemotherapy in January 2021. EXAM: NUCLEAR MEDICINE PET SKULL BASE TO THIGH TECHNIQUE: 11.8 mCi F-18 FDG was injected intravenously. Full-ring PET imaging was performed from the skull base to thigh after the radiotracer. CT data was obtained and used for attenuation correction and anatomic localization. Fasting blood glucose: 83 mg/dl COMPARISON:  Multiple exams, including 10/07/2019 FINDINGS: Mediastinal blood pool activity: SUV max 2.8 Liver activity: SUV max NA NECK: Previous focal hypermetabolic activity along the right tonsillar region has essentially resolved, maximum SUV in this vicinity 4.1 (formerly 12.2) and contralateral maximum SUV 3.9 there is reduce conspicuity of soft tissue planes in the vicinity of the mass likely from recent therapy. Poor definition of the prior right level IIa lymph nodes in this region measuring about 0.9 cm in short axis (formerly 1.7 cm by my measurement) with maximum SUV of approximately 2.6 (formerly 6.8. A right level IIb lymph node measures 0.4 cm in short axis on image 34/3 (formerly 1.2 cm) with maximum SUV of 1.7 (formerly 5.7). Glottic activity slightly eccentric to the  right with maximum SUV of 5.3, probably physiologic, no CT correlate. No new enlarged or hypermetabolic lymph nodes are identified. Incidental CT findings: Fluid filling most of the right mastoid air cells compatible with mastoid effusion. CHEST: No significant abnormal hypermetabolic activity in this region. Incidental CT findings: Atherosclerotic calcification of the aortic arch and of the left anterior descending coronary artery. ABDOMEN/PELVIS: No significant abnormal hypermetabolic activity in this region. Incidental CT findings: Nonobstructive left nephrolithiasis. SKELETON: No significant abnormal hypermetabolic activity in this region. Incidental CT findings: none IMPRESSION: 1. Marked improvement, with the right tonsillar region hypermetabolic activity a centrally resolved, and with prominent reduction in size and activity of the right level II lymph nodes. No new or appreciable active malignancy is identified. 2. Other imaging findings of potential clinical significance: Right mastoid effusion. Aortic Atherosclerosis (ICD10-I70.0). Coronary atherosclerosis. Nonobstructive left nephrolithiasis. Electronically Signed   By: Van Clines M.D.   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  PLAN:    1. 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  XRT completed on December 31, 2019.  PET scan results from March 02, 2020 reviewed independently and report as above with marked improvement of hypermetabolic activity and reduction in size of known lesion.  Patient also had an ENT evaluation with endoscopy approximately 2 weeks ago that was reported as normal.  No intervention is needed.  We will repeat imaging with CT scan in 6 months.  Return to clinic in 3 months for repeat laboratory work and further evaluation. 2.  Thrombocytopenia: Improved.  Platelets are 113. 3.   Leukopenia: Resolved. 4.  Anemia: Patient's hemoglobin continues to trend up and is now 11.8.   Patient expressed understanding and was in agreement with this plan. He also understands that He can call clinic at any time with any questions, concerns, or complaints.    Lloyd Huger, MD   03/08/2020 1:24 PM

## 2020-03-07 ENCOUNTER — Other Ambulatory Visit: Payer: Self-pay

## 2020-03-07 ENCOUNTER — Encounter: Payer: Self-pay | Admitting: Oncology

## 2020-03-07 NOTE — Progress Notes (Signed)
Patient would like to discuss PET scan results. Has numerous questions.

## 2020-03-08 ENCOUNTER — Encounter: Payer: Self-pay | Admitting: Oncology

## 2020-03-08 ENCOUNTER — Inpatient Hospital Stay (HOSPITAL_BASED_OUTPATIENT_CLINIC_OR_DEPARTMENT_OTHER): Payer: Medicare Other | Admitting: Oncology

## 2020-03-08 VITALS — BP 116/69 | HR 58 | Temp 96.7°F | Resp 20 | Wt 219.5 lb

## 2020-03-08 DIAGNOSIS — C099 Malignant neoplasm of tonsil, unspecified: Secondary | ICD-10-CM

## 2020-04-14 ENCOUNTER — Telehealth: Payer: Self-pay

## 2020-04-14 DIAGNOSIS — C099 Malignant neoplasm of tonsil, unspecified: Secondary | ICD-10-CM

## 2020-04-14 NOTE — Telephone Encounter (Signed)
Survivorship Care Plan visit completed.  Treatment summary reviewed and mailed to patient.  ASCO answers booklet reviewed and mailed to patient.  CARE program and Cancer Transitions discussed with patient along with other resources cancer center offers to patients and caregivers.  Patient verbalized understanding.  SCP packet mailed.  Patient in agreement for APP to have a Virtual visit to introduce them to the Survivorship Clinic.  Encouraged patient to call for any questions or concerns. 

## 2020-04-28 ENCOUNTER — Inpatient Hospital Stay: Payer: Medicare Other | Attending: Oncology | Admitting: Oncology

## 2020-04-28 ENCOUNTER — Other Ambulatory Visit: Payer: Self-pay

## 2020-04-28 DIAGNOSIS — C099 Malignant neoplasm of tonsil, unspecified: Secondary | ICD-10-CM | POA: Insufficient documentation

## 2020-04-28 DIAGNOSIS — Z87891 Personal history of nicotine dependence: Secondary | ICD-10-CM | POA: Diagnosis not present

## 2020-04-28 DIAGNOSIS — Z9221 Personal history of antineoplastic chemotherapy: Secondary | ICD-10-CM | POA: Insufficient documentation

## 2020-04-28 DIAGNOSIS — Z923 Personal history of irradiation: Secondary | ICD-10-CM | POA: Insufficient documentation

## 2020-04-28 DIAGNOSIS — Z79899 Other long term (current) drug therapy: Secondary | ICD-10-CM | POA: Insufficient documentation

## 2020-04-28 DIAGNOSIS — G629 Polyneuropathy, unspecified: Secondary | ICD-10-CM | POA: Diagnosis not present

## 2020-04-28 NOTE — Progress Notes (Signed)
[place] note [facility]  [phone] [phone]  Patient Care Team: Albina Billet, MD as PCP - General (Internal Medicine) Noreene Filbert, MD as Referring Physician (Radiation Oncology) Lloyd Huger, MD as Consulting Physician (Oncology)   Name of the patient: Bryan Gomez  [id number]  [birth year]   Date of visit: 04/28/20  CLINIC:  Survivorship   REASON FOR VISIT:  Survivorship Care Plan visit & follow-up post-treatment for a recent history of head & neck cancer.  BRIEF ONCOLOGIC HISTORY:  Oncology History  Right tonsillar squamous cell carcinoma ([place])  10/01/2019 Initial Diagnosis   Right tonsillar squamous cell carcinoma ([place])   10/21/2019 -  Chemotherapy   The patient had palonosetron (ALOXI) injection 0.25 mg, 0.25 mg, Intravenous,  Once, 7 of 8 cycles Administration: 0.25 mg (10/21/2019), 0.25 mg (10/28/2019), 0.25 mg (11/04/2019), 0.25 mg (11/12/2019), 0.25 mg (12/02/2019), 0.25 mg (12/16/2019), 0.25 mg (12/09/2019) CISplatin (PLATINOL) 97 mg in sodium chloride 0.9 % 250 mL chemo infusion, 40 mg/m2 = 97 mg, Intravenous,  Once, 7 of 8 cycles Dose modification: 30 mg/m2 (original dose 40 mg/m2, Cycle 5, Reason: Dose not tolerated) Administration: 97 mg (10/21/2019), 97 mg (10/28/2019), 97 mg (11/04/2019), 97 mg (11/12/2019), 73 mg (12/02/2019), 73 mg (12/16/2019), 73 mg (12/09/2019) fosaprepitant (EMEND) 150 mg, dexamethasone (DECADRON) 12 mg in sodium chloride 0.9 % 145 mL IVPB, , Intravenous,  Once, 7 of 8 cycles Administration:  (10/21/2019),  (10/28/2019),  (11/04/2019),  (11/12/2019),  (12/02/2019),  (12/16/2019),  (12/09/2019)  for chemotherapy treatment.      INTERVAL HISTORY:  Mr. Besecker is a 64 yo male who presents to the [facility] today for our initial meeting to review the survivorship care plan detailing his treatment course for head & neck cancer, as well as monitoring long-term side  effects of that treatment, education regarding health maintenance, screening, and overall wellness and health promotion.     Overall, Mr.Forest Becker reports feeling  quite well since completing his concurrent radiation/chemotherapy with Cisplatin which he completed in January 2021.  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   He is followed by Dr. Tami Ribas ENT.  He recently had a scope which was reported as no evidence of disease.  Most recent imaging by PET from March 02, 2020 with improved hypermetabolic activity and reduction in size of known lesion.  ADDITIONAL REVIEW OF SYSTEMS:  Review of Systems  Constitutional: Negative.  Negative for chills, fever, malaise/fatigue and weight loss.  HENT: Negative for congestion, ear pain and tinnitus.   Eyes: Negative.  Negative for blurred vision and double vision.  Respiratory: Negative.  Negative for cough, sputum production and shortness of breath.   Cardiovascular: Negative.  Negative for chest pain, palpitations and leg swelling.  Gastrointestinal: Negative.  Negative for abdominal pain, constipation, diarrhea, nausea and vomiting.  Genitourinary: Negative for dysuria, frequency and urgency.  Musculoskeletal: Negative for back pain and falls.  Skin: Negative.  Negative for rash.  Neurological: Positive for sensory change. Negative for weakness and headaches.  Endo/Heme/Allergies: Negative.  Does not bruise/bleed easily.  Psychiatric/Behavioral: Positive for memory loss. Negative for depression. The patient is not nervous/anxious and does not have insomnia.      PAST MEDICAL/SURGICAL HISTORY:  No past medical history on file. Past Surgical History:  Procedure Laterality Date  . ADENOIDECTOMY    . INGUINAL HERNIA REPAIR Left   . INGUINAL LYMPH NODE BIOPSY Right    removal of lymph node  .  TONSILLECTOMY       ALLERGIES:  No Known Allergies    CURRENT MEDICATIONS:  Current Outpatient Medications  on File Prior to Visit  Medication Sig Dispense Refill  . chlorpheniramine (CHLOR-TRIMETON) 2 MG/5ML syrup Take 2 mg by mouth every 4 (four) hours as needed for allergies.    Marland Kitchen diclofenac sodium (VOLTAREN) 1 % GEL Voltaren 1 % topical gel  APPLY 2 GRAM TO THE AFFECTED AREA(S) BY TOPICAL ROUTE 4 TIMES PER DAY    . ondansetron (ZOFRAN) 8 MG tablet Take 1 tablet (8 mg total) by mouth 2 (two) times daily as needed. (Patient not taking: Reported on 03/07/2020) 60 tablet 1  . sennosides-docusate sodium (SENOKOT-S) 8.6-50 MG tablet Take 1 tablet by mouth daily.    Marland Kitchen SILDENAFIL CITRATE PO CHEW 1 TO 2 TABLETS BY MOUTH 30 TO 45 MINUTES BEFORE SEXUAL ACTIVITY AS NEEDED     No current facility-administered medications on file prior to visit.       ONCOLOGIC FAMILY HISTORY:  Family History  Problem Relation Age of Onset  . Multiple myeloma Brother       SOCIAL HISTORY:  Mr. Ohm is married and lives at home with his wife in [place].  PHYSICAL EXAMINATION:  Vital Signs: Physical Exam Constitutional:      Appearance: Normal appearance.  HENT:     Head: Normocephalic and atraumatic.  Eyes:     Pupils: Pupils are equal, round, and reactive to light.  Cardiovascular:     Rate and Rhythm: Normal rate and regular rhythm.     Heart sounds: Normal heart sounds. No murmur.  Pulmonary:     Effort: Pulmonary effort is normal.     Breath sounds: Normal breath sounds. No wheezing.  Abdominal:     General: Bowel sounds are normal. There is no distension.     Palpations: Abdomen is soft.     Tenderness: There is no abdominal tenderness.  Musculoskeletal:        General: Normal range of motion.     Cervical back: Normal range of motion.  Skin:    General: Skin is warm and dry.     Findings: No rash.  Neurological:     Mental Status: He is alert and oriented to person, place, and time.  Psychiatric:        Judgment: Judgment normal.    LABORATORY DATA:  Lab Results    Component Value Date   WBC 4.2 03/02/2020   HGB 11.8 (L) 03/02/2020   HCT 33.5 (L) 03/02/2020   MCV 98.0 03/02/2020   PLT 113 (L) 03/02/2020     Chemistry      Component Value Date/Time   NA 139 03/02/2020 0832   NA 139 01/09/2015 1632   K 4.3 03/02/2020 0832   K 3.5 01/09/2015 1632   CL 105 03/02/2020 0832   CL 102 01/09/2015 1632   CO2 27 03/02/2020 0832   CO2 30 01/09/2015 1632   BUN 17 03/02/2020 0832   BUN 14 01/09/2015 1632   CREATININE 1.04 03/02/2020 0832   CREATININE 0.95 01/09/2015 1632      Component Value Date/Time   CALCIUM 9.1 03/02/2020 0832   CALCIUM 8.7 01/09/2015 1632   ALKPHOS 53 10/26/2019 0940   ALKPHOS 98 01/09/2015 1632   AST 18 10/26/2019 0940   AST 138 (H) 01/09/2015 1632   ALT 22 10/26/2019 0940   ALT 225 (H) 01/09/2015 1632   BILITOT 1.2 10/26/2019 0940   BILITOT 4.0 (  H) 01/09/2015 1632     DIAGNOSTIC IMAGING:  Pet scan 03/02/20  IMPRESSION: 1. Marked improvement, with the right tonsillar region hypermetabolic activity a centrally resolved, and with prominent reduction in size and activity of the right level II lymph nodes. No new or appreciable active malignancy is identified. 2. Other imaging findings of potential clinical significance: Right mastoid effusion. Aortic Atherosclerosis (ICD10-I70.0). Coronary atherosclerosis. Nonobstructive left nephrolithiasis.   ASSESSMENT AND PLAN:  Mr. Vroom is a 64 year old male with history of stage IV right tonsil cancer.  He was treated with 6 cycles of cisplatin (cycle 5 dose reduced secondary to thrombocytopenia) who completed therapy on 12/16/2019. Patient presents to survivorship clinic today for routine follow-up after finishing treatment.   1.  Right tonsil squamous cell carcinoma stage IVa:  Mr. Mohrmann is continuing to recover from the effects of cancer treatment.  Per surveillance protocol, he will follow-up with his ENT physician in this month with history, physical exam, and laryngoscopy  (Dr. Tami Ribas).  Today, a comprehensive survivorship care plan and treatment summary was reviewed with the patient today detailing his head & neck cancer diagnosis, treatment course, potential late/long-term effects of treatment, appropriate follow-up care with recommendations for the future, and patient education resources.  A copy of this summary, along with a letter will be sent to the patient's primary care provider via mail/fax/In Basket message after today's visit.  Mr. Cypert is welcome to return to the Survivorship Clinic in the future, as needed.  When he is approximately 2 years out from his diagnosis and treatment, he may be referred back to Survivorship for continued surveillance until his 5 year "graduation" from follow-up at the cancer center.   #. Problem(s) at Visit: Mr. Aspinall states that he has some concerns regarding his memory.  He feels that his short-term memory is getting worse by the day.  He notes some mild intermittent upper extremity neuropathy but it is tolerable.  He has attempted to do memory exercises such as working on crossword or sudoku puzzles.  #. Nutritional status: Mr. Manganaro reports that he is currently able to consume adequate nutrition by mouth.  he was encouraged to continue to consume adequate hydration and nutrition, as tolerated.   #. At risk for dysphagia: Given Mr. Bartko's treatment for Tonsil cancer, which included radiation therapy, he is at risk for chronic dysphagia.  He denies any current problems swallowing.  #.  At risk for neck lymphedema:  When patients with head & neck cancers are treated with surgery and/or radiation therapy, there is an associated increased risk of neck lymphedema.  Mr. Darco reports that currently he is experiencing what would be considered mild symptoms.  #. At risk for tooth decay/dental concerns: After treatment with radiation for head & neck cancers, patients often experience xerostomia which increases their risk of dental  caries. Mr. Breton was encouraged to see his dentist 3-4 times per year.  he should also continue to use his fluoride trays daily, as directed by dentistry.  I encouraged him to reach out to either Dr. Enrique Sack (oncology dentist) or his primary care dentist with additional questions or concerns.   #.  Lung cancer screening:  [place] now offers eligible patients lung cancer screening with a low-dose chest CT to aid in early detection, provide more effective treatment options, and ultimately improve survival benefits for patients diagnosed early.  Below is the selection criteria for screening:  . Medicare patients: 55-77 years; privately insured patients 55-80 years. . Active or  former smokers who have quit within the last 15 years. . 30+ pack-year history of smoking  . Exclusion criteria - No signs/symptoms of lung cancer (i.e., no recent history of hemoptysis and no unexplained weight loss >15 pounds in the last 6 months). . Willing and healthy enough to undergo biopsies/surgery if needed.  Mr. Salinger currently does not meet criteria for lung cancer screening.    #. Health maintenance and wellness promotion: Cancer patients who consume a diet rich in fruits and vegetables have better overall health and decreased risk of cancer recurrence. Mr. Enriques was encouraged to consume 5-7 servings of fruits and vegetables per day, as tolerated.  We discussed the CARE program, which is designed for cancer survivors to help them become more physically fit after cancer treatments.   #. Support services/counseling: It is not uncommon for this period of the patient's cancer care trajectory to be one of many emotions and stressors.  We discussed an opportunity for him to participate in the next session of Cancer Transitions support group series, designed for patients after they have completed treatment.   Mr. Deroos was encouraged to take advantage of our many other support services programs, support groups, and/or  counseling in coping with his new life as a cancer survivor after completing anti-cancer treatment.  he was offered support today through active listening and expressive supportive counseling.  he was given information regarding our available services and encouraged to contact me with any questions or for help enrolling in any of our support group/programs.     Dispo:  -RTC as scheduled for follow-up with Dr. Tami Ribas. -Return to cancer center to see Dr. Grayland Ormond as scheduled.   Greater than 50% was spent in counseling and coordination of care with this patient including but not limited to discussion of the relevant topics above (See A&P) including, but not limited to diagnosis and management of acute and chronic medical conditions.    Rulon Abide, AGNP-C [facility]   Note: PRIMARY CARE PROVIDER Albina Billet, MD [place] Fax: [phone]

## 2020-06-01 ENCOUNTER — Ambulatory Visit: Payer: Medicare Other | Admitting: Radiation Oncology

## 2020-06-02 ENCOUNTER — Ambulatory Visit
Admission: RE | Admit: 2020-06-02 | Discharge: 2020-06-02 | Disposition: A | Discharge status: Home / Self Care | Payer: Medicare Other | Source: Ambulatory Visit | Attending: Radiation Oncology | Admitting: Radiation Oncology

## 2020-06-02 ENCOUNTER — Encounter: Payer: Self-pay | Admitting: Radiation Oncology

## 2020-06-02 ENCOUNTER — Other Ambulatory Visit: Payer: Self-pay

## 2020-06-02 VITALS — BP 125/57 | HR 70 | Temp 98.3°F | Resp 16 | Wt 225.6 lb

## 2020-06-02 DIAGNOSIS — Z9221 Personal history of antineoplastic chemotherapy: Secondary | ICD-10-CM | POA: Insufficient documentation

## 2020-06-02 DIAGNOSIS — Z85819 Personal history of malignant neoplasm of unspecified site of lip, oral cavity, and pharynx: Secondary | ICD-10-CM | POA: Diagnosis not present

## 2020-06-02 DIAGNOSIS — Z923 Personal history of irradiation: Secondary | ICD-10-CM | POA: Diagnosis not present

## 2020-06-02 DIAGNOSIS — C099 Malignant neoplasm of tonsil, unspecified: Secondary | ICD-10-CM

## 2020-06-02 NOTE — Progress Notes (Signed)
Radiation Oncology Follow up Note  Name: Bryan Gomez   Date:   06/02/2020 MRN:  [id number] DOB: 1956/06/27    This 64 y.o. male presents to the clinic today for 7-month follow-up status post concurrent chemoradiation therapy for stage IV a squamous cell carcinoma the right tonsil.  REFERRING PROVIDER: Albina Billet, MD  HPI: Patient is a 64 year old male now out 5 months having completed concurrent chemoradiation therapy for stage IV (T2 N2 M0) squamous cell carcinoma of the right tonsil.  Seen today in routine follow-up he is doing well specifically denies any dysphagia head and neck pain.  His taste is returning.Marland Kitchen  He had PET CT scan back in March showing marked improvement with right tonsillar region hypermetabolic [place] essentially resolved and with prominent reduction in size and activity of right level 2 nodes.  He is seeing ENT and has had excellent examinations showing no evidence of disease.  COMPLICATIONS OF TREATMENT: none  FOLLOW UP COMPLIANCE: keeps appointments   PHYSICAL EXAM:  BP (!) 125/57   Pulse 70   Temp 98.3 F (36.8 C)   Resp 16   Wt 225 lb 9.6 oz (102.3 kg)   SpO2 99%   BMI 28.97 kg/m  No evidence of cervical or supraclavicular adenopathy is identified.  Well-developed well-nourished patient in NAD. HEENT reveals PERLA, EOMI, discs not visualized.  Oral cavity is clear. No oral mucosal lesions are identified. Neck is clear without evidence of cervical or supraclavicular adenopathy. Lungs are clear to A&P. Cardiac examination is essentially unremarkable with regular rate and rhythm without murmur rub or thrill. Abdomen is benign with no organomegaly or masses noted. Motor sensory and DTR levels are equal and symmetric in the upper and lower extremities. Cranial nerves II through XII are grossly intact. Proprioception is intact. No peripheral adenopathy or edema is identified. No motor or sensory levels are noted. Crude visual fields are within normal  range.  RADIOLOGY RESULTS: PET CT scan reviewed compatible with above-stated findings  PLAN: Present time patient is doing well with no evidence of disease now at 5 months and pleased with his overall progress.  Very little side effect profile taste is returning continues close follow-up care with ENT.  I have asked to see him out in 6 months and then will start once year follow-up visits.  Patient knows to call with any concerns.  I would like to take this opportunity to thank you for allowing me to participate in the care of your patient.Noreene Filbert, MD

## 2020-06-12 NOTE — Progress Notes (Signed)
[place]  [phone] [phone]  ID: Bryan Gomez OB: [birth year]  MR#: [id number]  LVD#:[id number]  Patient Care Team: Albina Billet, MD as PCP - General (Internal Medicine) Noreene Filbert, MD as Referring Physician (Radiation Oncology) Lloyd Huger, MD as Consulting Physician (Oncology)  I connected with Bryan Gomez on 06/17/20 at  2:15 PM EDT by video enabled telemedicine visit and verified that I am speaking with the correct person using two identifiers.   I discussed the limitations, risks, security and privacy concerns of performing an evaluation and management service by telemedicine and the availability of in-person appointments. I also discussed with the patient that there may be a patient responsible charge related to this service. The patient expressed understanding and agreed to proceed.   Other persons participating in the visit and their role in the encounter: Patient, MD.  Patient's location: Work. Provider's location: Clinic.  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  INTERVAL HISTORY: Patient agreed to video assisted telemedicine visit for routine 41-monthevaluation.  He continues to feel well and remains asymptomatic.  He does not complain of weakness or fatigue.  He does not complain of dry mouth today.  He has no neurologic complaints.  He denies any recent fevers or illnesses.  He has a fair appetite and denies weight loss.  He has no chest pain, shortness of breath, cough, or hemoptysis.  He denies any nausea, vomiting, constipation, or diarrhea.  He has no urinary complaints.  Patient offers no specific complaints today.  REVIEW OF SYSTEMS:   Review of Systems  Constitutional: Negative for fever, malaise/fatigue and weight loss.  HENT: Negative.  Negative for sore throat.   Respiratory: Negative.  Negative for cough, hemoptysis and shortness of breath.   Cardiovascular: Negative.   Negative for chest pain and leg swelling.  Gastrointestinal: Negative.  Negative for abdominal pain and nausea.  Genitourinary: Negative.  Negative for dysuria.  Musculoskeletal: Negative.  Negative for back pain.  Skin: Negative.  Negative for rash.  Neurological: Negative.  Negative for dizziness, focal weakness, weakness and headaches.  Psychiatric/Behavioral: Negative.  The patient is not nervous/anxious.     As per HPI. Otherwise, a complete review of systems is negative.  PAST MEDICAL HISTORY: No past medical history on file.  PAST SURGICAL HISTORY: Past Surgical History:  Procedure Laterality Date  . ADENOIDECTOMY    . INGUINAL HERNIA REPAIR Left   . INGUINAL LYMPH NODE BIOPSY Right    removal of lymph node  . TONSILLECTOMY      FAMILY HISTORY: Family History  Problem Relation Age of Onset  . Multiple myeloma Brother     ADVANCED DIRECTIVES (Y/N):  N  HEALTH MAINTENANCE: Social History   Tobacco Use  . Smoking status: Never Smoker  . Smokeless tobacco: Never Used  Vaping Use  . Vaping Use: Never used  Substance Use Topics  . Alcohol use: Not Currently  . Drug use: Not Currently     Colonoscopy:  PAP:  Bone density:  Lipid panel:  No Known Allergies  Current Outpatient Medications  Medication Sig Dispense Refill  . chlorpheniramine (CHLOR-TRIMETON) 2 MG/5ML syrup Take 2 mg by mouth every 4 (four) hours as needed for allergies.    .Marland Kitchendiclofenac sodium (VOLTAREN) 1 % GEL Voltaren 1 % topical gel  APPLY 2 GRAM TO THE AFFECTED AREA(S) BY TOPICAL ROUTE 4 TIMES PER DAY    . ondansetron (ZOFRAN) 8 MG tablet Take  1 tablet (8 mg total) by mouth 2 (two) times daily as needed. (Patient not taking: Reported on 03/07/2020) 60 tablet 1  . sennosides-docusate sodium (SENOKOT-S) 8.6-50 MG tablet Take 1 tablet by mouth daily.    Marland Kitchen SILDENAFIL CITRATE PO CHEW 1 TO 2 TABLETS BY MOUTH 30 TO 45 MINUTES BEFORE SEXUAL ACTIVITY AS NEEDED     No current facility-administered  medications for this visit.    OBJECTIVE: There were no vitals filed for this visit.   There is no height or weight on file to calculate BMI.    ECOG FS:0 - Asymptomatic  General: Well-developed, well-nourished, no acute distress. HEENT: Normocephalic. Neuro: Alert, answering all questions appropriately. Cranial nerves grossly intact. Psych: Normal affect.  LAB RESULTS:  Lab Results  Component Value Date   NA 139 03/02/2020   K 4.3 03/02/2020   CL 105 03/02/2020   CO2 27 03/02/2020   GLUCOSE 103 (H) 03/02/2020   BUN 17 03/02/2020   CREATININE 1.04 03/02/2020   CALCIUM 9.1 03/02/2020   PROT 7.2 10/26/2019   ALBUMIN 4.1 10/26/2019   AST 18 10/26/2019   ALT 22 10/26/2019   ALKPHOS 53 10/26/2019   BILITOT 1.2 10/26/2019   GFRNONAA >60 03/02/2020   GFRAA >60 03/02/2020    Lab Results  Component Value Date   WBC 4.2 03/02/2020   NEUTROABS 2.9 03/02/2020   HGB 11.8 (L) 03/02/2020   HCT 33.5 (L) 03/02/2020   MCV 98.0 03/02/2020   PLT 113 (L) 03/02/2020     STUDIES: No results found.  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  PLAN:    1. 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  He completed XRT on December 31, 2019.  PET scan results from March 02, 2020 reviewed independently with marked improvement of hypermetabolic activity and reduction in size of known lesion.  Patient reports his most recent ENT evaluation with endoscopy approximately 3 weeks ago was reported as normal.  No intervention is needed at this time.  Return to clinic in 3 months with repeat imaging and further evaluation.  If CT scan at that time reveals no evidence of disease, no further imaging will be necessary after that.   2.  Thrombocytopenia: Improved.  Platelets are 129. 3.  Leukopenia: Resolved. 4.  Anemia: Essentially resolved. 5.  Renal insufficiency: Patient's creatinine has trended up  to 1.27.  Have encouraged increased fluid intake.  I provided 20 minutes of face-to-face video visit time during this encounter which included chart review, counseling, and coordination of care as documented above.    Patient expressed understanding and was in agreement with this plan. He also understands that He can call clinic at any time with any questions, concerns, or complaints.    Lloyd Huger, MD   06/12/2020 9:32 AM

## 2020-06-14 ENCOUNTER — Inpatient Hospital Stay: Payer: Medicare Other | Attending: Oncology

## 2020-06-14 ENCOUNTER — Other Ambulatory Visit: Payer: Self-pay

## 2020-06-14 DIAGNOSIS — Z79899 Other long term (current) drug therapy: Secondary | ICD-10-CM | POA: Diagnosis not present

## 2020-06-14 DIAGNOSIS — D696 Thrombocytopenia, unspecified: Secondary | ICD-10-CM | POA: Diagnosis not present

## 2020-06-14 DIAGNOSIS — N289 Disorder of kidney and ureter, unspecified: Secondary | ICD-10-CM | POA: Insufficient documentation

## 2020-06-14 DIAGNOSIS — Z923 Personal history of irradiation: Secondary | ICD-10-CM | POA: Diagnosis not present

## 2020-06-14 DIAGNOSIS — C099 Malignant neoplasm of tonsil, unspecified: Secondary | ICD-10-CM | POA: Insufficient documentation

## 2020-06-14 LAB — CBC WITH DIFFERENTIAL/PLATELET
Abs Immature Granulocytes: 0.03 10*3/uL (ref 0.00–0.07)
Basophils Absolute: 0 10*3/uL (ref 0.0–0.1)
Basophils Relative: 1 %
Eosinophils Absolute: 0.3 10*3/uL (ref 0.0–0.5)
Eosinophils Relative: 6 %
HCT: 35.7 % — ABNORMAL LOW (ref 39.0–52.0)
Hemoglobin: 12.8 g/dL — ABNORMAL LOW (ref 13.0–17.0)
Immature Granulocytes: 1 %
Lymphocytes Relative: 15 %
Lymphs Abs: 0.8 10*3/uL (ref 0.7–4.0)
MCH: 32.7 pg (ref 26.0–34.0)
MCHC: 35.9 g/dL (ref 30.0–36.0)
MCV: 91.3 fL (ref 80.0–100.0)
Monocytes Absolute: 0.5 10*3/uL (ref 0.1–1.0)
Monocytes Relative: 9 %
Neutro Abs: 3.4 10*3/uL (ref 1.7–7.7)
Neutrophils Relative %: 68 %
Platelets: 129 10*3/uL — ABNORMAL LOW (ref 150–400)
RBC: 3.91 MIL/uL — ABNORMAL LOW (ref 4.22–5.81)
RDW: 13.1 % (ref 11.5–15.5)
WBC: 5 10*3/uL (ref 4.0–10.5)
nRBC: 0 % (ref 0.0–0.2)

## 2020-06-14 LAB — BASIC METABOLIC PANEL
Anion gap: 8 (ref 5–15)
BUN: 26 mg/dL — ABNORMAL HIGH (ref 8–23)
CO2: 29 mmol/L (ref 22–32)
Calcium: 9 mg/dL (ref 8.9–10.3)
Chloride: 103 mmol/L (ref 98–111)
Creatinine, Ser: 1.27 mg/dL — ABNORMAL HIGH (ref 0.61–1.24)
GFR calc Af Amer: >60 mL/min (ref >60)
GFR calc non Af Amer: 58 mL/min — ABNORMAL LOW (ref >60)
Glucose, Bld: 119 mg/dL — ABNORMAL HIGH (ref 70–99)
Potassium: 5 mmol/L (ref 3.5–5.1)
Sodium: 140 mmol/L (ref 135–145)

## 2020-06-16 ENCOUNTER — Inpatient Hospital Stay (HOSPITAL_BASED_OUTPATIENT_CLINIC_OR_DEPARTMENT_OTHER): Payer: Medicare Other | Admitting: Oncology

## 2020-06-16 ENCOUNTER — Encounter: Payer: Self-pay | Admitting: Oncology

## 2020-06-16 DIAGNOSIS — C099 Malignant neoplasm of tonsil, unspecified: Secondary | ICD-10-CM

## 2020-06-16 NOTE — Progress Notes (Signed)
Patient reports no pain or concerns at this time.  

## 2020-09-08 NOTE — Progress Notes (Signed)
[place]  [phone] Fax:(336) [phone]  ID: Bryan Gomez OB: 01/02/1956  MR#: [id number]  BXI#:[id number]  Patient Care Team: Bryan Billet, MD as PCP - General (Internal Medicine) Bryan Filbert, MD as Referring Physician (Radiation Oncology) Bryan Huger, MD as Consulting Physician (Oncology)  I connected with Bryan Gomez on 09/16/20 at  2:00 PM EDT by video enabled telemedicine visit and verified that I am speaking with the correct person using two identifiers.   I discussed the limitations, risks, security and privacy concerns of performing an evaluation and management service by telemedicine and the availability of in-person appointments. I also discussed with the patient that there may be a patient responsible charge related to this service. The patient expressed understanding and agreed to proceed.   Other persons participating in the visit and their role in the encounter: Patient, MD.  Patient's location: Home. Provider's location: Clinic.  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  INTERVAL HISTORY: Patient agreed to video assisted telemedicine visit for routine 75-monthevaluation and discussion of his imaging results.  He has a chronic cough that is worse with eating and drinking, but otherwise feels well.  He denies any weakness or fatigue.  He does not complain of any dry mouth or dysphagia. He has no neurologic complaints.  He denies any recent fevers or illnesses.  He has a fair appetite and denies weight loss.  He has no chest pain, shortness of breath, or hemoptysis.  He denies any nausea, vomiting, constipation, or diarrhea.  He has no urinary complaints.  Patient otherwise feels well and offers no further specific complaints today.  REVIEW OF SYSTEMS:   Review of Systems  Constitutional: Negative for fever, malaise/fatigue and weight loss.  HENT: Negative.  Negative for sore throat.    Respiratory: Positive for cough. Negative for hemoptysis and shortness of breath.   Cardiovascular: Negative.  Negative for chest pain and leg swelling.  Gastrointestinal: Negative.  Negative for abdominal pain and nausea.  Genitourinary: Negative.  Negative for dysuria.  Musculoskeletal: Negative.  Negative for back pain.  Skin: Negative.  Negative for rash.  Neurological: Negative.  Negative for dizziness, focal weakness, weakness and headaches.  Psychiatric/Behavioral: Negative.  The patient is not nervous/anxious.     As per HPI. Otherwise, a complete review of systems is negative.  PAST MEDICAL HISTORY: Past Medical History:  Diagnosis Date  . Cancer ([facility]     PAST SURGICAL HISTORY: Past Surgical History:  Procedure Laterality Date  . ADENOIDECTOMY    . INGUINAL HERNIA REPAIR Left   . INGUINAL LYMPH NODE BIOPSY Right    removal of lymph node  . TONSILLECTOMY      FAMILY HISTORY: Family History  Problem Relation Age of Onset  . Multiple myeloma Brother     ADVANCED DIRECTIVES (Y/N):  N  HEALTH MAINTENANCE: Social History   Tobacco Use  . Smoking status: Never Smoker  . Smokeless tobacco: Never Used  Vaping Use  . Vaping Use: Never used  Substance Use Topics  . Alcohol use: Not Currently  . Drug use: Not Currently     Colonoscopy:  PAP:  Bone density:  Lipid panel:  No Known Allergies  Current Outpatient Medications  Medication Sig Dispense Refill  . chlorpheniramine (CHLOR-TRIMETON) 2 MG/5ML syrup Take 2 mg by mouth every 4 (four) hours as needed for allergies.    .Marland Kitchendiclofenac sodium (VOLTAREN) 1 % GEL Voltaren 1 % topical gel  APPLY 2 GRAM TO THE AFFECTED AREA(S) BY TOPICAL ROUTE 4 TIMES PER DAY    . ondansetron (ZOFRAN) 8 MG tablet Take 1 tablet (8 mg total) by mouth 2 (two) times daily as needed. (Patient not taking: Reported on 06/16/2020) 60 tablet 1  . sennosides-docusate sodium (SENOKOT-S) 8.6-50 MG tablet Take 1 tablet by mouth daily. (Patient  not taking: Reported on 09/15/2020)    . SILDENAFIL CITRATE PO CHEW 1 TO 2 TABLETS BY MOUTH 30 TO 45 MINUTES BEFORE SEXUAL ACTIVITY AS NEEDED     No current facility-administered medications for this visit.    OBJECTIVE: There were no vitals filed for this visit.   There is no height or weight on file to calculate BMI.    ECOG FS:0 - Asymptomatic  General: Well-developed, well-nourished, no acute distress. HEENT: Normocephalic. Neuro: Alert, answering all questions appropriately. Cranial nerves grossly intact. Psych: Normal affect.   LAB RESULTS:  Lab Results  Component Value Date   NA 140 06/14/2020   K 5.0 06/14/2020   CL 103 06/14/2020   CO2 29 06/14/2020   GLUCOSE 119 (H) 06/14/2020   BUN 26 (H) 06/14/2020   CREATININE 1.20 09/12/2020   CALCIUM 9.0 06/14/2020   PROT 7.2 10/26/2019   ALBUMIN 4.1 10/26/2019   AST 18 10/26/2019   ALT 22 10/26/2019   ALKPHOS 53 10/26/2019   BILITOT 1.2 10/26/2019   GFRNONAA 58 (L) 06/14/2020   GFRAA >60 06/14/2020    Lab Results  Component Value Date   WBC 5.0 06/14/2020   NEUTROABS 3.4 06/14/2020   HGB 12.8 (L) 06/14/2020   HCT 35.7 (L) 06/14/2020   MCV 91.3 06/14/2020   PLT 129 (L) 06/14/2020     STUDIES: CT SOFT TISSUE NECK W CONTRAST  Result Date: 09/12/2020 CLINICAL DATA:  Follow-up stage IV squamous cell carcinoma of the right tonsil status post chemotherapy and radiation. EXAM: CT NECK WITH CONTRAST TECHNIQUE: Multidetector CT imaging of the neck was performed using the standard protocol following the bolus administration of intravenous contrast. CONTRAST:  20m OMNIPAQUE IOHEXOL 300 MG/ML  SOLN COMPARISON:  PET-CT 03/02/2020.  Neck CT 09/15/2019. FINDINGS: Pharynx and larynx: Mild submucosal edema compatible with prior radiation therapy. No recurrent tonsillar mass identified. Salivary glands: Asymmetric post radiation changes involving the right submandibular gland. No salivary stone or mass. Thyroid: Unchanged subcentimeter  right thyroid nodule, considered clinically insignificant and for which no follow-up imaging is recommended. Lymph nodes: Post radiation changes in the right neck. Abnormal right level II lymph nodes on the prior neck CT have resolved, and no new enlarged or suspicious lymph nodes are identified in the neck on either side. Vascular: Major vascular structures of the neck are patent. Limited intracranial: Unremarkable. Visualized orbits: Unremarkable. Mastoids and visualized paranasal sinuses: Paranasal sinuses and mastoid air cells are clear. Skeleton: No suspicious osseous lesion. Moderate cervical disc degeneration. Upper chest: No apical lung consolidation or mass. Other: None. IMPRESSION: Post treatment changes in the neck without evidence of recurrent disease. Electronically Signed   By: ALogan BoresM.D.   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  PLAN:    1. 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  He completed XRT on December 31, 2019.  PET scan results from March 02, 2020 reviewed independently with marked improvement of hypermetabolic activity and reduction in size of known lesion.  CT  scan results from September 12, 2020 reviewed independently and report as above with no obvious evidence of recurrent or progressive disease.  He also reports a normal endoscopy by ENT approximately 2 weeks ago.  No intervention is needed at this time.  Patient does not require any further imaging unless there is suspicion of recurrence.  Return to clinic in 6 months with repeat laboratory work and further evaluation.    2.  Thrombocytopenia: Patient's most recent platelet count had improved to 129.  Monitor. 3.  Leukopenia: Resolved. 4.  Anemia: Essentially resolved. 5.  Renal insufficiency: Resolved.  Patient's most recent creatinine is 1.2. 6.  Cough: Appears to be related to  aspiration.  Chest x-ray has been ordered for completeness.  Patient was also given a referral to speech pathology.  I provided 20 minutes of face-to-face video visit time during this encounter which included chart review, counseling, and coordination of care as documented above.   Patient expressed understanding and was in agreement with this plan. He also understands that He can call clinic at any time with any questions, concerns, or complaints.    Bryan Huger, MD   09/16/2020 3:58 PM

## 2020-09-12 ENCOUNTER — Ambulatory Visit
Admission: RE | Admit: 2020-09-12 | Discharge: 2020-09-12 | Disposition: A | Discharge status: Home / Self Care | Payer: Medicare Other | Source: Ambulatory Visit | Attending: Oncology | Admitting: Oncology

## 2020-09-12 ENCOUNTER — Other Ambulatory Visit: Payer: Self-pay

## 2020-09-12 DIAGNOSIS — C099 Malignant neoplasm of tonsil, unspecified: Secondary | ICD-10-CM | POA: Insufficient documentation

## 2020-09-12 HISTORY — DX: Malignant (primary) neoplasm, unspecified: C80.1

## 2020-09-12 LAB — POCT I-STAT CREATININE: Creatinine, Ser: 1.2 mg/dL (ref 0.61–1.24)

## 2020-09-12 MED ORDER — IOHEXOL 300 MG/ML  SOLN
75.0000 mL | Freq: Once | INTRAMUSCULAR | Status: AC | PRN
  Administered 2020-09-12: 75 mL via INTRAVENOUS

## 2020-09-15 ENCOUNTER — Encounter: Payer: Self-pay | Admitting: Oncology

## 2020-09-15 NOTE — Progress Notes (Signed)
The patient reports he has had a cough x 2 months.The patient Name and DOB has been verified by phone today.

## 2020-09-16 ENCOUNTER — Other Ambulatory Visit: Payer: Self-pay

## 2020-09-16 ENCOUNTER — Inpatient Hospital Stay: Payer: Medicare Other | Attending: Oncology | Admitting: Oncology

## 2020-09-16 DIAGNOSIS — Z9221 Personal history of antineoplastic chemotherapy: Secondary | ICD-10-CM | POA: Insufficient documentation

## 2020-09-16 DIAGNOSIS — R059 Cough, unspecified: Secondary | ICD-10-CM

## 2020-09-16 DIAGNOSIS — Z923 Personal history of irradiation: Secondary | ICD-10-CM | POA: Insufficient documentation

## 2020-09-16 DIAGNOSIS — D696 Thrombocytopenia, unspecified: Secondary | ICD-10-CM | POA: Insufficient documentation

## 2020-09-16 DIAGNOSIS — Z79899 Other long term (current) drug therapy: Secondary | ICD-10-CM | POA: Insufficient documentation

## 2020-09-16 DIAGNOSIS — R1310 Dysphagia, unspecified: Secondary | ICD-10-CM

## 2020-09-16 DIAGNOSIS — C099 Malignant neoplasm of tonsil, unspecified: Secondary | ICD-10-CM | POA: Insufficient documentation

## 2020-09-17 ENCOUNTER — Encounter: Payer: Self-pay | Admitting: Oncology

## 2020-09-23 ENCOUNTER — Ambulatory Visit
Admission: RE | Admit: 2020-09-23 | Discharge: 2020-09-23 | Disposition: A | Discharge status: Home / Self Care | Payer: Medicare Other | Attending: Oncology | Admitting: Oncology

## 2020-09-23 ENCOUNTER — Ambulatory Visit
Admission: RE | Admit: 2020-09-23 | Discharge: 2020-09-23 | Disposition: A | Discharge status: Home / Self Care | Payer: Medicare Other | Source: Ambulatory Visit | Attending: Oncology | Admitting: Oncology

## 2020-09-23 ENCOUNTER — Telehealth: Payer: Self-pay | Admitting: *Deleted

## 2020-09-23 ENCOUNTER — Other Ambulatory Visit: Payer: Self-pay

## 2020-09-23 DIAGNOSIS — R059 Cough, unspecified: Secondary | ICD-10-CM | POA: Diagnosis present

## 2020-09-23 DIAGNOSIS — C099 Malignant neoplasm of tonsil, unspecified: Secondary | ICD-10-CM

## 2020-09-23 NOTE — Telephone Encounter (Signed)
Results of chest x-ray done today are not returned yet.  Please follow-up on speech path referral please

## 2020-09-23 NOTE — Telephone Encounter (Signed)
Patient called reporting that he has not heard anything about the swallowing study or the xr that doctor wants. I left voice mail that cxr has been ordered and that he does not need appointment for that , he just needs to go get it done. I see the referral for speech, but no appt

## 2020-09-26 NOTE — Telephone Encounter (Signed)
Call return fromehab and the order was not in their work que It needed to be sent to [facility], she will change order and call patient to schedule him

## 2020-09-26 NOTE — Telephone Encounter (Signed)
Called Speech Therapy and got voice mail, I left message regarding referral and asked to be called back

## 2020-09-27 ENCOUNTER — Other Ambulatory Visit: Payer: Self-pay | Admitting: *Deleted

## 2020-09-27 DIAGNOSIS — C099 Malignant neoplasm of tonsil, unspecified: Secondary | ICD-10-CM

## 2020-09-27 NOTE — Therapy (Signed)
[facility] [address] [place], [place], [place], [address] Phone: [phone]   Fax:  [phone]  Speech Language Pathology Screen  Patient Details  Name: Bryan Gomez MRN: [id number] Date of Birth: 11/13/1956 No data recorded  Encounter Date: 09/16/2020    End of Session - 09/27/20 1647    Visit Number 0           Past Medical History:  Diagnosis Date  . Cancer [facility])     Past Surgical History:  Procedure Laterality Date  . ADENOIDECTOMY    . INGUINAL HERNIA REPAIR Left   . INGUINAL LYMPH NODE BIOPSY Right    removal of lymph node  . TONSILLECTOMY      There were no vitals filed for this visit.   Subjective Assessment - 09/27/20 1647    Subjective spoke w/ pt via phone this afternoon              Received Pt Screening Referral from Team at [facility]. Reviewed chart notes prior to speaking w/ pt via telephone.  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 He is followed by the [facility] for routine 15-month discussion. Recently, pt reported to MD that he has a "chronic cough". He does not c/o dry mouth or dysphagia per MD note; otherwise feels "well". Upon speaking w/ pt this afternoon, he described that his "cough" occurs ~30 minutes POST meals and NOT every meal, nor every day. Pt denied any swallowing difficulty DURING the meal: no coughing or throat clearing, no decline in breathing, no discomfort swallowing. Pt denied any difficulty masticating or swallowing any particular food consistency or liquids. Noted pt's recent CXR (09/25/2020): "No active cardiopulmonary disease".  When asked about any h/o Reflux, pt endorsed he had been treated for Reflux ~4 years ago by his PCP. He had taken Prilosec then but was Not currently taking a PPI now. Post further discussion of potential s/s of Reflux(and Reflux in general) vs s/s of aspiration, pt stated he would f/u  w/ his PCP re: further Reflux discussion and potential need for PPI again. An objective modified barium swallow study(SLP svcs) is not recommended at this time but can be performed if pt/MD so wishes at any time. Pt agreed. Encouraged pt to f/u w/ his PCP if any changes in his swallowing occur at any point in the future. Pt agreed to do so.        Orinda Kenner, MS, CCC-SLP Speech Language Pathologist [facility] [phone]                    Right tonsillar squamous cell carcinoma [facility]) - Plan: DG Chest 2 View, Ambulatory referral to Speech Therapy, CANCELED: CT Chest W Contrast  Cough - Plan: DG Chest 2 View, Ambulatory referral to Speech Therapy  Dysphagia, unspecified type    Problem List Patient Active Problem List   Diagnosis Date Noted  . Right tonsillar squamous cell carcinoma ([place]) 10/01/2019    Santiaga Butzin 09/27/2020, 4:49 PM  [facility] [address] [place], [place], [place], [address] Phone: [phone]   Fax:  [phone]  Name: Bryan Gomez MRN: [id number] Date of Birth: 1956/10/05

## 2020-12-14 ENCOUNTER — Ambulatory Visit: Payer: Medicare Other | Admitting: Radiation Oncology

## 2020-12-14 ENCOUNTER — Ambulatory Visit
Admission: RE | Admit: 2020-12-14 | Discharge: 2020-12-14 | Disposition: A | Discharge status: Home / Self Care | Payer: Medicare Other | Source: Ambulatory Visit | Attending: Radiation Oncology | Admitting: Radiation Oncology

## 2020-12-14 ENCOUNTER — Other Ambulatory Visit: Payer: Self-pay

## 2020-12-14 VITALS — BP 121/66 | HR 60 | Temp 97.4°F | Wt 246.0 lb

## 2020-12-14 DIAGNOSIS — R1310 Dysphagia, unspecified: Secondary | ICD-10-CM | POA: Insufficient documentation

## 2020-12-14 DIAGNOSIS — Z85819 Personal history of malignant neoplasm of unspecified site of lip, oral cavity, and pharynx: Secondary | ICD-10-CM | POA: Insufficient documentation

## 2020-12-14 DIAGNOSIS — Z923 Personal history of irradiation: Secondary | ICD-10-CM | POA: Insufficient documentation

## 2020-12-14 DIAGNOSIS — M7660 Achilles tendinitis, unspecified leg: Secondary | ICD-10-CM | POA: Insufficient documentation

## 2020-12-14 DIAGNOSIS — C099 Malignant neoplasm of tonsil, unspecified: Secondary | ICD-10-CM

## 2020-12-14 NOTE — Progress Notes (Signed)
Radiation Oncology Follow up Note  Name: Bryan Gomez   Date:   12/14/2020 MRN:  [id number] DOB: [birth year]    This 65 y.o. male presents to the clinic today for 28-month follow-up status post concurrent chemoradiation therapy for stage IVa squamous cell carcinoma the right tonsil.  REFERRING PROVIDER: Jaclyn Shaggy, MD  HPI: Patient is a 65 year old male now at 39 months of completed concurrent chemoradiation therapy for stage IV (T2 N2 M0) squamous cell carcinoma the right tonsil.  Seen today in routine follow-up he is doing well.  Does continue to have some mild dysphagia some slight cough upon eating.  His weight is stable.  He specifically denies head and neck pain.  He is being seen on a routine basis by ENT with no evidence of disease.Marland Kitchen  He had a head and neck CT scan back in October which I have reviewed shows posttreatment changes without evidence of recurrent or persistent disease.  COMPLICATIONS OF TREATMENT: none  FOLLOW UP COMPLIANCE: keeps appointments   PHYSICAL EXAM:  BP 121/66 (BP Location: Right Arm, Patient Position: Sitting, Cuff Size: Normal)   Pulse 60   Temp (!) 97.4 F (36.3 C) (Tympanic)   Wt 246 lb (111.6 kg)   BMI 31.58 kg/m  No evident evidence of cervical or supraclavicular adenopathy.  Well-developed well-nourished patient in NAD. HEENT reveals PERLA, EOMI, discs not visualized.  Oral cavity is clear. No oral mucosal lesions are identified. Neck is clear without evidence of cervical or supraclavicular adenopathy. Lungs are clear to A&P. Cardiac examination is essentially unremarkable with regular rate and rhythm without murmur rub or thrill. Abdomen is benign with no organomegaly or masses noted. Motor sensory and DTR levels are equal and symmetric in the upper and lower extremities. Cranial nerves II through XII are grossly intact. Proprioception is intact. No peripheral adenopathy or edema is identified. No motor or sensory levels are noted. Crude visual fields  are within normal range.  RADIOLOGY RESULTS: CT scans reviewed compatible with above-stated findings  PLAN: Present time he continues to do well with no evidence of disease now at 11 months from concurrent chemoradiation and pleased with his overall progress.  I have suggested some Mucinex for his slight dysphagia.  That may help with some of his secretions.  I have asked to see him back in 6 months for follow-up.  He continues close follow-up care with ENT.  Patient knows to call with any concerns.  I would like to take this opportunity to thank you for allowing me to participate in the care of your patient.Carmina Miller, MD

## 2021-03-20 ENCOUNTER — Inpatient Hospital Stay (HOSPITAL_BASED_OUTPATIENT_CLINIC_OR_DEPARTMENT_OTHER): Payer: Medicare Other | Admitting: Oncology

## 2021-03-20 ENCOUNTER — Other Ambulatory Visit: Payer: Self-pay

## 2021-03-20 ENCOUNTER — Inpatient Hospital Stay: Payer: Medicare Other | Attending: Oncology

## 2021-03-20 VITALS — BP 124/69 | HR 66 | Temp 99.4°F | Resp 16 | Wt 247.0 lb

## 2021-03-20 DIAGNOSIS — R059 Cough, unspecified: Secondary | ICD-10-CM

## 2021-03-20 DIAGNOSIS — Z923 Personal history of irradiation: Secondary | ICD-10-CM | POA: Diagnosis not present

## 2021-03-20 DIAGNOSIS — Z9221 Personal history of antineoplastic chemotherapy: Secondary | ICD-10-CM | POA: Insufficient documentation

## 2021-03-20 DIAGNOSIS — Z79899 Other long term (current) drug therapy: Secondary | ICD-10-CM | POA: Insufficient documentation

## 2021-03-20 DIAGNOSIS — C099 Malignant neoplasm of tonsil, unspecified: Secondary | ICD-10-CM | POA: Diagnosis not present

## 2021-03-20 LAB — COMPREHENSIVE METABOLIC PANEL
ALT: 22 U/L (ref 0–44)
AST: 18 U/L (ref 15–41)
Albumin: 3.9 g/dL (ref 3.5–5.0)
Alkaline Phosphatase: 60 U/L (ref 38–126)
Anion gap: 6 (ref 5–15)
BUN: 19 mg/dL (ref 8–23)
CO2: 26 mmol/L (ref 22–32)
Calcium: 8.7 mg/dL — ABNORMAL LOW (ref 8.9–10.3)
Chloride: 108 mmol/L (ref 98–111)
Creatinine, Ser: 1.19 mg/dL (ref 0.61–1.24)
GFR, Estimated: >60 mL/min (ref >60)
Glucose, Bld: 101 mg/dL — ABNORMAL HIGH (ref 70–99)
Potassium: 4.9 mmol/L (ref 3.5–5.1)
Sodium: 140 mmol/L (ref 135–145)
Total Bilirubin: 0.6 mg/dL (ref 0.3–1.2)
Total Protein: 6.6 g/dL (ref 6.5–8.1)

## 2021-03-20 LAB — CBC WITH DIFFERENTIAL/PLATELET
Abs Immature Granulocytes: 0.02 10*3/uL (ref 0.00–0.07)
Basophils Absolute: 0 10*3/uL (ref 0.0–0.1)
Basophils Relative: 1 %
Eosinophils Absolute: 0.4 10*3/uL (ref 0.0–0.5)
Eosinophils Relative: 7 %
HCT: 38.1 % — ABNORMAL LOW (ref 39.0–52.0)
Hemoglobin: 13.2 g/dL (ref 13.0–17.0)
Immature Granulocytes: 0 %
Lymphocytes Relative: 15 %
Lymphs Abs: 0.9 10*3/uL (ref 0.7–4.0)
MCH: 31.7 pg (ref 26.0–34.0)
MCHC: 34.6 g/dL (ref 30.0–36.0)
MCV: 91.6 fL (ref 80.0–100.0)
Monocytes Absolute: 0.5 10*3/uL (ref 0.1–1.0)
Monocytes Relative: 8 %
Neutro Abs: 4.6 10*3/uL (ref 1.7–7.7)
Neutrophils Relative %: 69 %
Platelets: 170 10*3/uL (ref 150–400)
RBC: 4.16 MIL/uL — ABNORMAL LOW (ref 4.22–5.81)
RDW: 12.5 % (ref 11.5–15.5)
WBC: 6.5 10*3/uL (ref 4.0–10.5)
nRBC: 0 % (ref 0.0–0.2)

## 2021-03-20 MED ORDER — AMOXICILLIN-POT CLAVULANATE 875-125 MG PO TABS: 1.0000 | ORAL_TABLET | Freq: Two times a day (BID) | ORAL | 0 refills | Status: AC

## 2021-03-20 MED ORDER — ALBUTEROL SULFATE HFA 108 (90 BASE) MCG/ACT IN AERS: 2.0000 | INHALATION_SPRAY | Freq: Four times a day (QID) | RESPIRATORY_TRACT | 2 refills | Status: AC | PRN

## 2021-03-20 NOTE — Progress Notes (Signed)
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  INTERVAL HISTORY: Mr. Mizrahi is a 65-year-old male who presents for follow-up.  He was last seen in clinic on 09/16/2020.    He has been doing well since his last visit.  Had visit with Dr. McQueen last month and had routine laryngoscopy which was reported as normal.  Endorses 2-week upper sinus congestion yellow nasal discharge.  Admits to coughing spells causing insomnia.  Has tried Allegra and Flonase.  Feels he may need an antibiotic.  Otherwise, he is doing well.  REVIEW OF SYSTEMS:   Review of Systems  Constitutional: Negative for fever, malaise/fatigue and weight loss.  HENT: Positive for congestion. Negative for sore throat.   Respiratory: Positive for cough, sputum production and shortness of breath. Negative for hemoptysis.   Cardiovascular: Negative.  Negative for chest pain and leg swelling.  Gastrointestinal: Negative.  Negative for abdominal pain and nausea.  Genitourinary: Negative.  Negative for dysuria.  Musculoskeletal: Negative.  Negative for back pain.  Skin: Negative.  Negative for rash.  Neurological: Negative.  Negative for dizziness, focal weakness, weakness and headaches.  Psychiatric/Behavioral: Negative.  The patient is not nervous/anxious.     As per HPI. Otherwise, a complete review of systems is negative.  PAST MEDICAL HISTORY: Past Medical History:  Diagnosis Date  . Cancer (HCC)     PAST SURGICAL HISTORY: Past Surgical History:  Procedure Laterality Date  . ADENOIDECTOMY    . INGUINAL HERNIA REPAIR Left   . INGUINAL LYMPH NODE BIOPSY  Right    removal of lymph node  . TONSILLECTOMY      FAMILY HISTORY: Family History  Problem Relation Age of Onset  . Multiple myeloma Brother     ADVANCED DIRECTIVES (Y/N):  N  HEALTH MAINTENANCE: Social History   Tobacco Use  . Smoking status: Never Smoker  . Smokeless tobacco: Never Used  Vaping Use  . Vaping Use: Never used  Substance Use Topics  . Alcohol use: Not Currently  . Drug use: Not Currently     Colonoscopy:  PAP:  Bone density:  Lipid panel:  No Known Allergies  Current Outpatient Medications  Medication Sig Dispense Refill  . chlorpheniramine (CHLOR-TRIMETON) 2 MG/5ML syrup Take 2 mg by mouth every 4 (four) hours as needed for allergies.    . fexofenadine (ALLEGRA) 180 MG tablet Take 180 mg by mouth daily.    . diclofenac sodium (VOLTAREN) 1 % GEL Voltaren 1 % topical gel  APPLY 2 GRAM TO THE AFFECTED AREA(S) BY TOPICAL ROUTE 4 TIMES PER DAY (Patient not taking: Reported on 03/20/2021)    . omeprazole (PRILOSEC) 40 MG capsule Take 40 mg by mouth daily. (Patient not taking: Reported on 03/20/2021)    . sennosides-docusate sodium (SENOKOT-S) 8.6-50 MG tablet Take 1 tablet by mouth daily.    . SILDENAFIL CITRATE PO CHEW 1 TO 2 TABLETS BY MOUTH 30 TO 45 MINUTES BEFORE SEXUAL ACTIVITY AS NEEDED     No current facility-administered medications for this visit.    OBJECTIVE: Vitals:   03/20/21 1413  BP: 124/69  Pulse: 66  Resp: 16  Temp: 99.4 F (37.4 C)  SpO2: 97%       Body mass index is 31.71 kg/m.    ECOG FS:0 - Asymptomatic   Physical Exam Constitutional:      Appearance: Normal appearance.  HENT:     Head: Normocephalic and atraumatic.     Nose:     Right Sinus: Maxillary sinus tenderness and frontal sinus tenderness present.     Left Sinus: Maxillary sinus tenderness and frontal sinus tenderness present.  Eyes:     Pupils: Pupils are equal, round, and reactive to light.  Cardiovascular:     Rate and Rhythm: Normal rate and regular  rhythm.     Heart sounds: Normal heart sounds. No murmur heard.   Pulmonary:     Effort: Pulmonary effort is normal.     Breath sounds: Normal breath sounds. No wheezing.  Abdominal:     General: Bowel sounds are normal. There is no distension.     Palpations: Abdomen is soft.     Tenderness: There is no abdominal tenderness.  Musculoskeletal:        General: Normal range of motion.     Cervical back: Normal range of motion.  Skin:    General: Skin is warm and dry.     Findings: No rash.  Neurological:     Mental Status: He is alert and oriented to person, place, and time.  Psychiatric:        Judgment: Judgment normal.        LAB RESULTS:  Lab Results  Component Value Date   NA 140 03/20/2021   K 4.9 03/20/2021   CL 108 03/20/2021   CO2 26 03/20/2021   GLUCOSE 101 (H) 03/20/2021   BUN 19 03/20/2021   CREATININE 1.19 03/20/2021   CALCIUM 8.7 (L) 03/20/2021   PROT 6.6 03/20/2021   ALBUMIN 3.9 03/20/2021   AST 18 03/20/2021   ALT 22 03/20/2021   ALKPHOS 60 03/20/2021   BILITOT 0.6 03/20/2021   GFRNONAA >60 03/20/2021   GFRAA >60 06/14/2020    Lab Results  Component Value Date   WBC 6.5 03/20/2021   NEUTROABS 4.6 03/20/2021   HGB 13.2 03/20/2021   HCT 38.1 (L) 03/20/2021   MCV 91.6 03/20/2021   PLT 170 03/20/2021     STUDIES: No results found.  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  PLAN:    1. 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 -PET scan from 03/02/2020 showed improvement of hypermetabolic activity and reduction in size of known lesion. -CT scan from 09/12/2020 without evidence of recurrent or progressive disease. -He is followed by ENT-Dr. McQueen- N[facility] with recent laryngoscopy -He does not require any additional imaging unless suspicion of recurrence. -RTC in 6 months with repeat labs and evaluation.  2.  Sinus infection: -Symptoms  have been present for about 2 weeks. -Has tried Allegra and Flonase. -Reports nasal drainage-yellow color. -Has seasonal allergies. -Recommend Augmentin x10 days.  Disposition: -RTC in 6 months with repeat lab work and MD assessment.  Greater than 50% was spent in counseling and coordination of care with this patient including but not limited to discussion of the relevant topics above (See A&P) including, but not limited to diagnosis and management of acute and chronic medical conditions.   Patient expressed understanding and was in agreement with this plan. He also understands that He can call clinic at any time with any questions, concerns, or complaints.    Jennifer E Burns, NP     03/20/2021 2:22 PM     

## 2021-06-14 ENCOUNTER — Ambulatory Visit: Payer: Medicare Other | Admitting: Radiation Oncology

## 2021-06-19 ENCOUNTER — Encounter: Payer: Self-pay | Admitting: Radiation Oncology

## 2021-06-19 ENCOUNTER — Ambulatory Visit
Admission: RE | Admit: 2021-06-19 | Discharge: 2021-06-19 | Disposition: A | Discharge status: Home / Self Care | Payer: Medicare Other | Source: Ambulatory Visit | Attending: Radiation Oncology | Admitting: Radiation Oncology

## 2021-06-19 ENCOUNTER — Other Ambulatory Visit: Payer: Self-pay

## 2021-06-19 DIAGNOSIS — Z923 Personal history of irradiation: Secondary | ICD-10-CM | POA: Insufficient documentation

## 2021-06-19 DIAGNOSIS — C099 Malignant neoplasm of tonsil, unspecified: Secondary | ICD-10-CM | POA: Insufficient documentation

## 2021-06-19 NOTE — Progress Notes (Signed)
Radiation Oncology Follow up Note  Name: [name]   Date:   06/19/2021 MRN:  [id number] DOB: 1956-10-28    This 65 y.o. male presents to the clinic today for 64-month follow-up status post concurrent chemoradiation therapy for stage IVa squamous cell carcinoma the right tonsil  REFERRING PROVIDER: Albina Billet, MD  HPI: Patient is a 65 year old male now out.  17 months out having completed concurrent chemoradiation therapy for stage IV a squamous cell carcinoma of the right tonsil.  Seen today in routine follow-up he is doing fairly well weight is stable p.o. intake is good he is having no head and neck neck pain or dysphagia continues to have a cough it may be related to his sinusitis or allergies.  He had CT scan back in October which I have reviewed shows posttreatment changes without evidence of recurrent disease.  COMPLICATIONS OF TREATMENT: none  FOLLOW UP COMPLIANCE: keeps appointments   PHYSICAL EXAM:  BP (!) (P) 143/74 (BP Location: Left Arm, Patient Position: Sitting)   Pulse (!) (P) 55   Temp (!) (P) 96.4 F (35.8 C) (Tympanic)   Resp (P) 16   Wt (P) 256 lb 9.6 oz (116.4 kg)   BMI (P) 32.95 kg/m  Neck is clear without evidence of cervical or supraclavicular adenopathy.  Oropharynx is clear.  Well-developed well-nourished patient in NAD. HEENT reveals PERLA, EOMI, discs not visualized.  Oral cavity is clear. No oral mucosal lesions are identified. Neck is clear without evidence of cervical or supraclavicular adenopathy. Lungs are clear to A&P. Cardiac examination is essentially unremarkable with regular rate and rhythm without murmur rub or thrill. Abdomen is benign with no organomegaly or masses noted. Motor sensory and DTR levels are equal and symmetric in the upper and lower extremities. Cranial nerves II through XII are grossly intact. Proprioception is intact. No peripheral adenopathy or edema is identified. No motor or sensory levels are noted. Crude visual fields are  within normal range.  RADIOLOGY RESULTS: CT scan reviewed compatible with above-stated findings  PLAN: This time patient is doing well with no evidence of disease now at 17 months.  I have tried to assure him the cough is most likely not from his radiation therapy but may be due to some on the underlying pathology of his lungs.  I would suggest a chest x-ray at some time by his GP also would consider treatment for allergies as well as sinusitis.  I have asked to see him back in 6 months and then will start seeing him once a year.  Patient knows to call with any concerns.  I would like to take this opportunity to thank you for allowing me to participate in the care of your patient.Noreene Filbert, MD

## 2021-07-18 ENCOUNTER — Other Ambulatory Visit: Payer: Self-pay | Admitting: Internal Medicine

## 2021-07-18 ENCOUNTER — Ambulatory Visit
Admission: RE | Admit: 2021-07-18 | Discharge: 2021-07-18 | Disposition: A | Discharge status: Home / Self Care | Payer: Medicare Other | Source: Ambulatory Visit | Attending: Internal Medicine | Admitting: Internal Medicine

## 2021-07-18 ENCOUNTER — Other Ambulatory Visit: Payer: Self-pay

## 2021-07-18 ENCOUNTER — Ambulatory Visit
Admission: RE | Admit: 2021-07-18 | Discharge: 2021-07-18 | Disposition: A | Discharge status: Home / Self Care | Payer: Medicare Other | Attending: Internal Medicine | Admitting: Internal Medicine

## 2021-07-18 DIAGNOSIS — R059 Cough, unspecified: Secondary | ICD-10-CM | POA: Insufficient documentation

## 2021-08-17 ENCOUNTER — Encounter: Payer: Self-pay | Admitting: *Deleted

## 2021-08-20 IMAGING — CT CT NECK W/ CM
5 series · 16 of 33 positions shown, 18 images · IV contrast (omnipaque)
Comparison: None.

CLINICAL DATA: Right submandibular mass for 1 month

EXAM:
CT NECK WITH CONTRAST
TECHNIQUE: Multidetector CT imaging of the neck was performed using the
standard protocol following the bolus administration of intravenous
contrast.
CONTRAST:  75mL OMNIPAQUE IOHEXOL 300 MG/ML  SOLN

[Series 2: axial neck neck (person_name) · axial · 0.62mm/px · z∈[-700,-552]mm · 3 of 148 slices shown]
[im 37/148  bone]
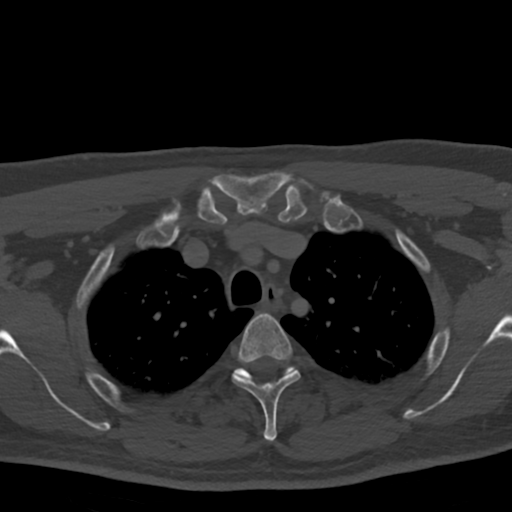
[im 74/148  bone]
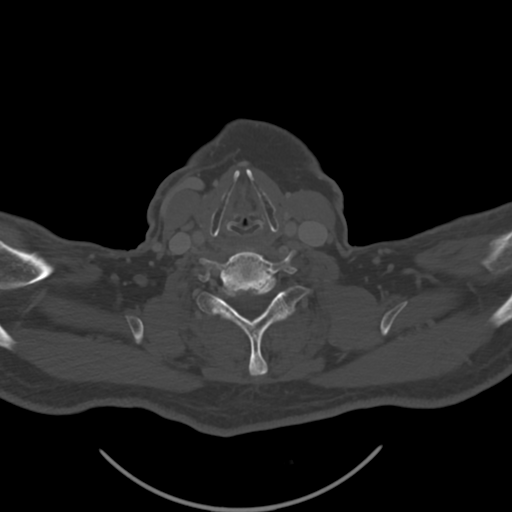
[im 111/148  bone]
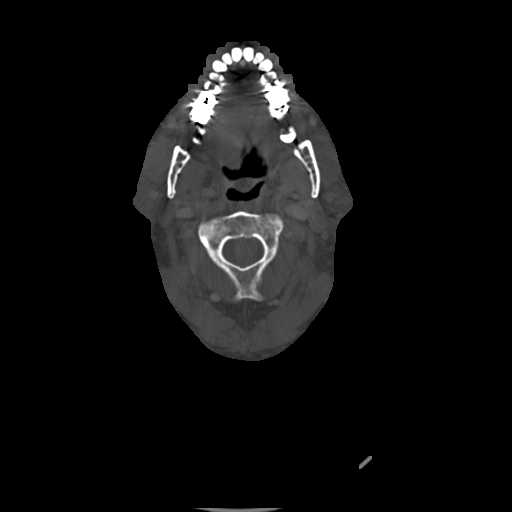

[Series 3: axial bone neck · axial · 0.62mm/px · z∈[-674,-576]mm · 2 of 148 slices shown]
[im 50/148  bone]
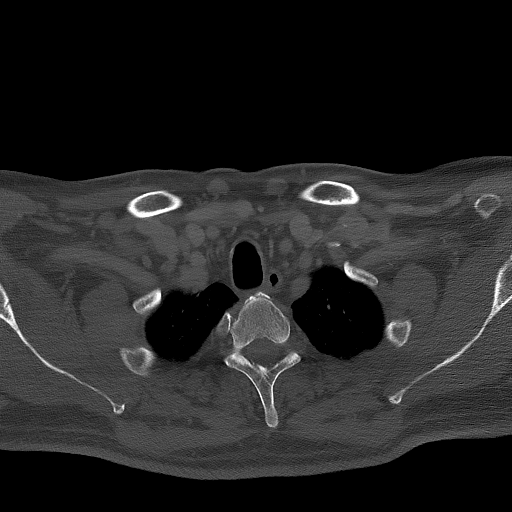
[im 99/148  bone]
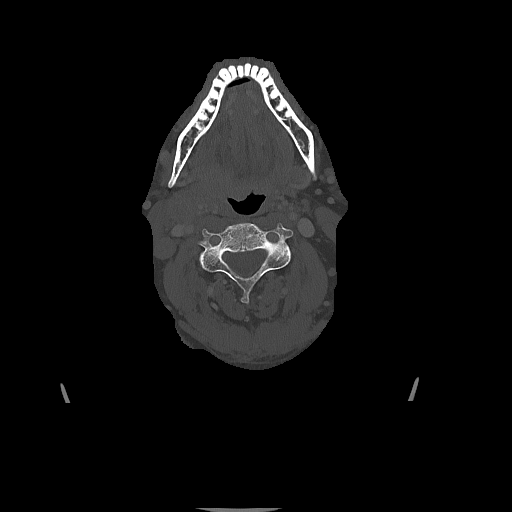

[Series 4: coronal neck neck (person_name) · coronal · 0.66mm/px · 3 of 158 slices shown]
[im 45/158  bone]
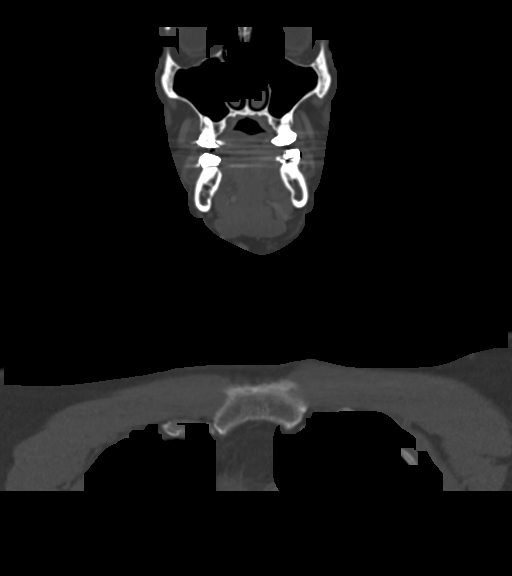
[im 68/158  bone]
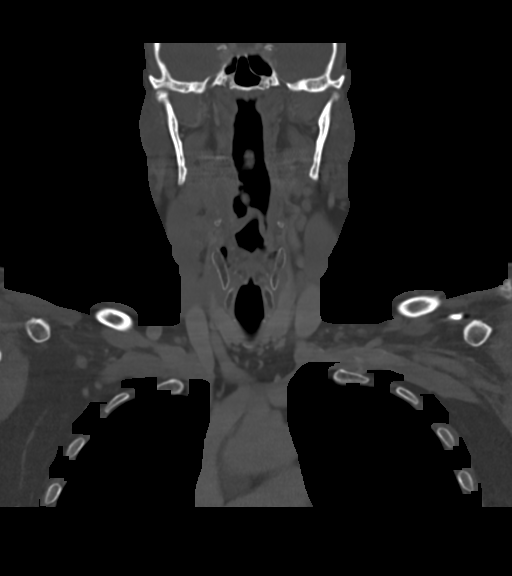
[im 91/158  bone]
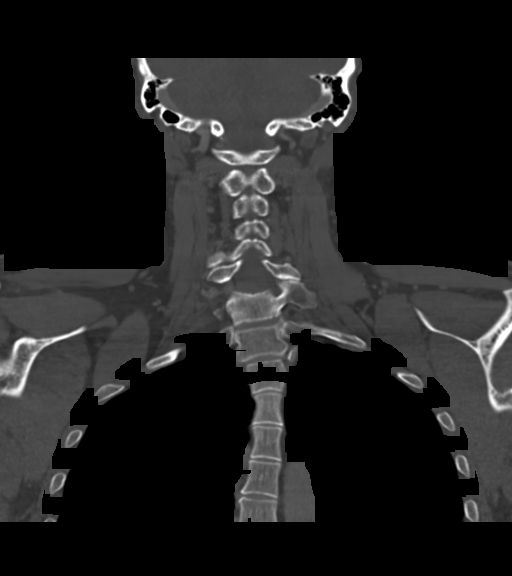

[Series 6: sagittal neck neck (person_name) · sagittal · 0.62mm/px · 5 of 167 slices shown, 6 images]
[im 56/167  bone]
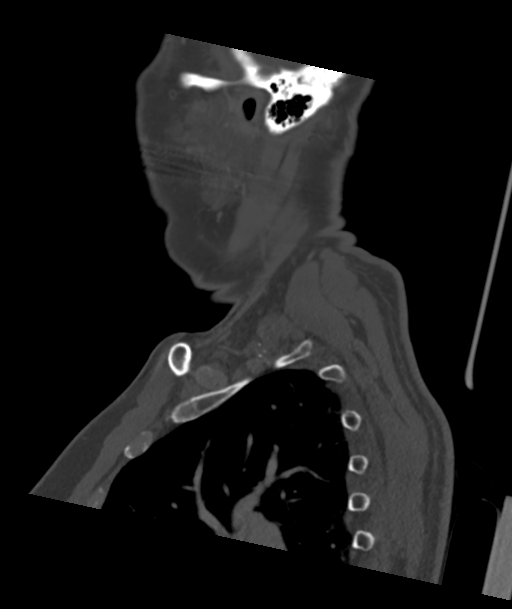
[im 70/167  bone]
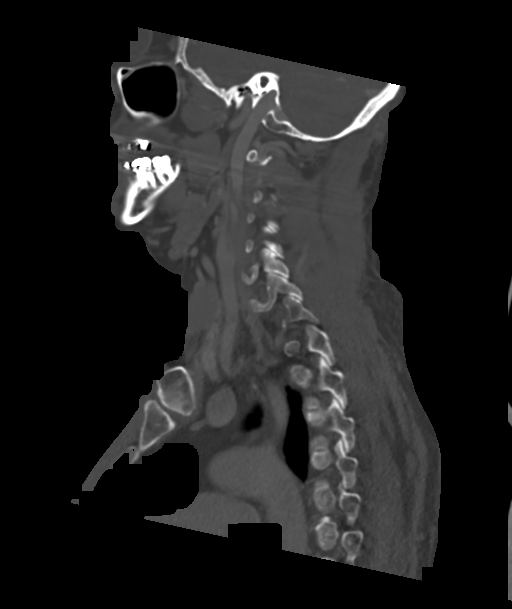
[im 84/167  soft-tissue]
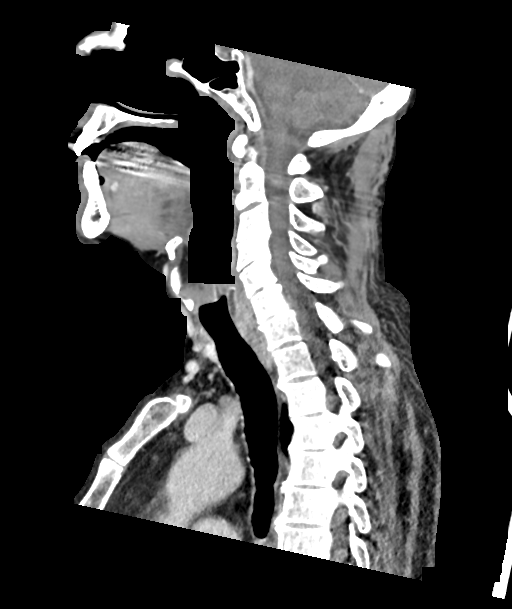
[im 84/167  bone]
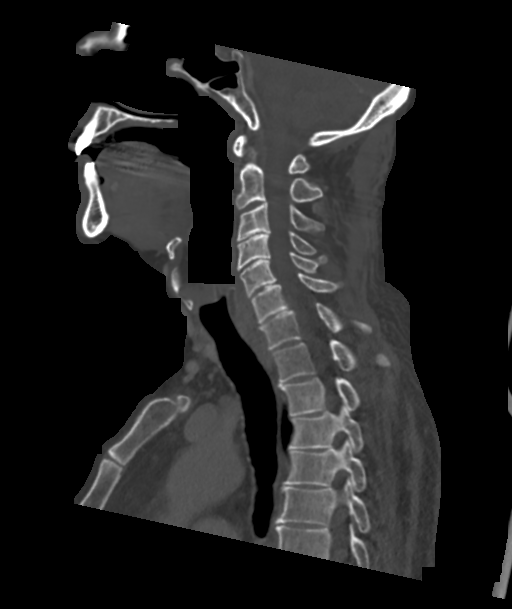
[im 97/167  bone]
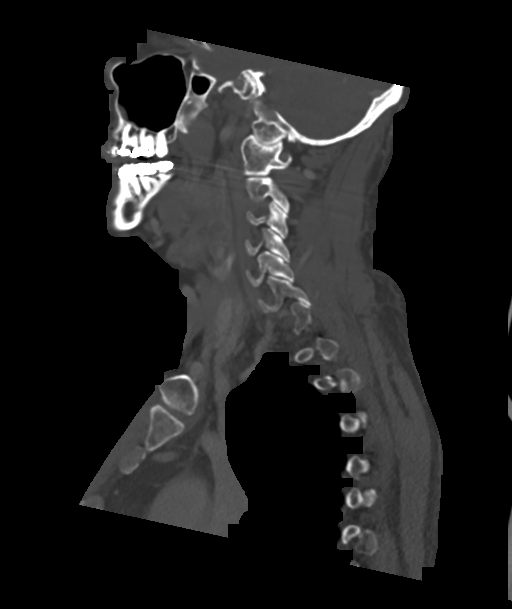
[im 111/167  bone]
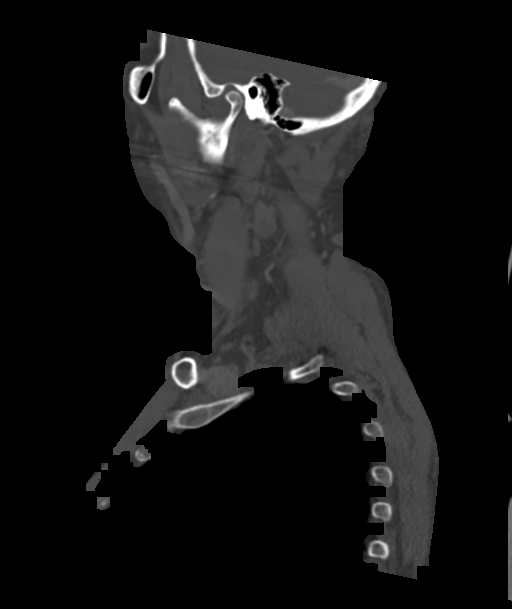

[Series 8: ax oropharynx neck neck (person_name) · axial · 0.62mm/px · z∈[-751,-571]mm · 3 of 186 slices shown, 4 images]
[im 47/186  soft-tissue]
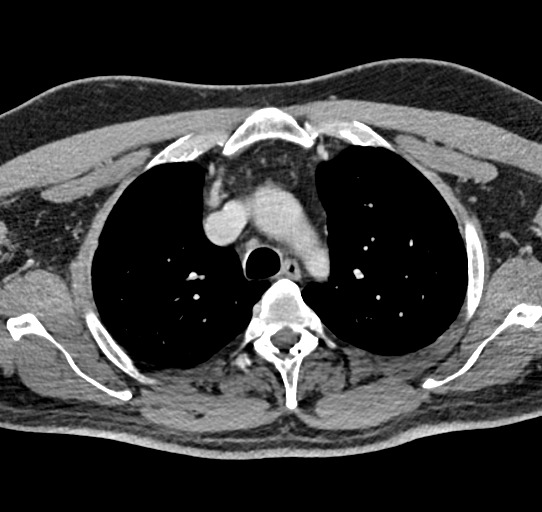
[im 47/186  bone]
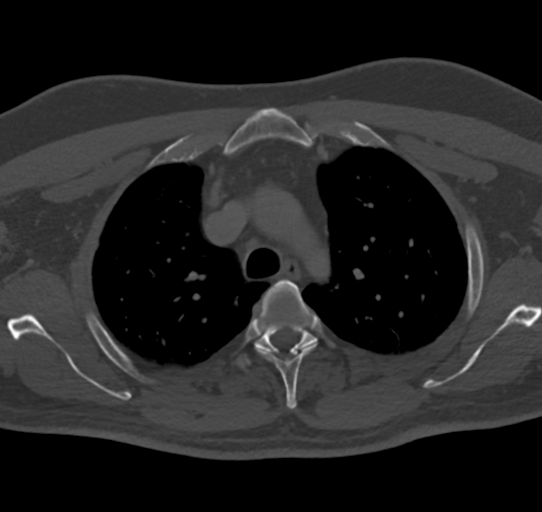
[im 93/186  bone]
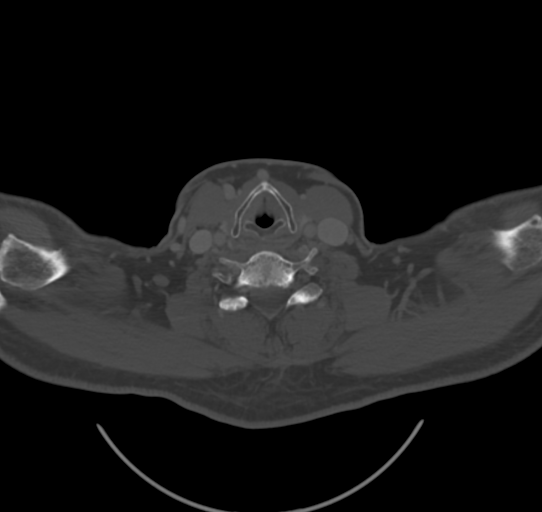
[im 139/186  bone]
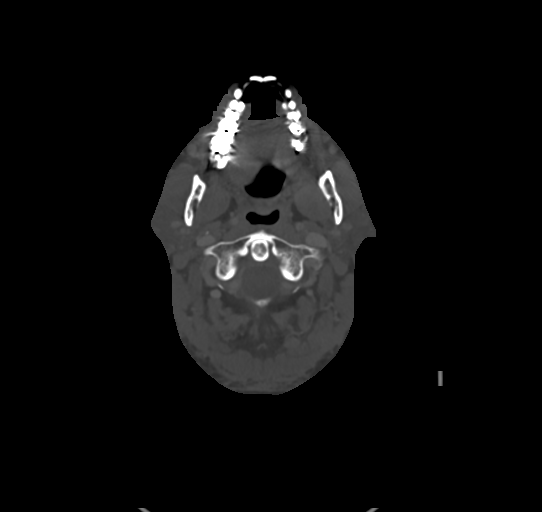

[16 of 33 positions shown; findings below may reference images not displayed]

FINDINGS: Pharynx and larynx: 2.5 cm enhancing mass at the right
glossotonsillar sulcus with submucosal extension into the tongue
musculature. No bony or carotid space invasion.

Salivary glands: Negative for mass or inflammation.

Thyroid: Subcentimeter right thyroid nodule, incidental

Lymph nodes: 4 enlarged and rounded right upper jugular chain lymph
nodes measuring up to 17 mm in diameter. No contralateral adenopathy
is seen.

Vascular: Negative

Limited intracranial: Negative

Visualized orbits: Negative

Mastoids and visualized paranasal sinuses: Polypoid densities in the
bilateral nasal cavity. There has been endoscopic sinus surgery
previously.

Skeleton: No acute or aggressive finding. Cervical degenerative disc
narrowing.

Upper chest: Negative
IMPRESSION: 2.5 cm mass at the right glossotonsillar sulcus consistent with
carcinoma. There are multiple ipsilateral lymph nodes in the upper
jugular chain measuring up to 17 mm.

Prior endoscopic sinus surgery.  There are bilateral nasal polyps.

## 2021-09-18 ENCOUNTER — Inpatient Hospital Stay: Payer: Medicare Other | Attending: Oncology

## 2021-09-18 ENCOUNTER — Encounter: Payer: Self-pay | Admitting: Oncology

## 2021-09-18 ENCOUNTER — Other Ambulatory Visit: Payer: Self-pay

## 2021-09-18 ENCOUNTER — Inpatient Hospital Stay (HOSPITAL_BASED_OUTPATIENT_CLINIC_OR_DEPARTMENT_OTHER): Payer: Medicare Other | Admitting: Oncology

## 2021-09-18 VITALS — BP 125/69 | HR 67 | Temp 98.3°F | Resp 16 | Wt 249.8 lb

## 2021-09-18 DIAGNOSIS — Z9221 Personal history of antineoplastic chemotherapy: Secondary | ICD-10-CM | POA: Insufficient documentation

## 2021-09-18 DIAGNOSIS — R059 Cough, unspecified: Secondary | ICD-10-CM | POA: Insufficient documentation

## 2021-09-18 DIAGNOSIS — Z923 Personal history of irradiation: Secondary | ICD-10-CM | POA: Insufficient documentation

## 2021-09-18 DIAGNOSIS — C099 Malignant neoplasm of tonsil, unspecified: Secondary | ICD-10-CM | POA: Diagnosis present

## 2021-09-18 LAB — COMPREHENSIVE METABOLIC PANEL
ALT: 21 U/L (ref 0–44)
AST: 20 U/L (ref 15–41)
Albumin: 4 g/dL (ref 3.5–5.0)
Alkaline Phosphatase: 52 U/L (ref 38–126)
Anion gap: 5 (ref 5–15)
BUN: 16 mg/dL (ref 8–23)
CO2: 27 mmol/L (ref 22–32)
Calcium: 8.6 mg/dL — ABNORMAL LOW (ref 8.9–10.3)
Chloride: 104 mmol/L (ref 98–111)
Creatinine, Ser: 1.02 mg/dL (ref 0.61–1.24)
GFR, Estimated: >60 mL/min (ref >60)
Glucose, Bld: 101 mg/dL — ABNORMAL HIGH (ref 70–99)
Potassium: 4.3 mmol/L (ref 3.5–5.1)
Sodium: 136 mmol/L (ref 135–145)
Total Bilirubin: 0.8 mg/dL (ref 0.3–1.2)
Total Protein: 6.7 g/dL (ref 6.5–8.1)

## 2021-09-18 LAB — CBC WITH DIFFERENTIAL/PLATELET
Abs Immature Granulocytes: 0.02 10*3/uL (ref 0.00–0.07)
Basophils Absolute: 0 10*3/uL (ref 0.0–0.1)
Basophils Relative: 0 %
Eosinophils Absolute: 0.2 10*3/uL (ref 0.0–0.5)
Eosinophils Relative: 4 %
HCT: 39.9 % (ref 39.0–52.0)
Hemoglobin: 13.8 g/dL (ref 13.0–17.0)
Immature Granulocytes: 0 %
Lymphocytes Relative: 19 %
Lymphs Abs: 0.9 10*3/uL (ref 0.7–4.0)
MCH: 31.5 pg (ref 26.0–34.0)
MCHC: 34.6 g/dL (ref 30.0–36.0)
MCV: 91.1 fL (ref 80.0–100.0)
Monocytes Absolute: 0.5 10*3/uL (ref 0.1–1.0)
Monocytes Relative: 11 %
Neutro Abs: 3 10*3/uL (ref 1.7–7.7)
Neutrophils Relative %: 66 %
Platelets: 135 10*3/uL — ABNORMAL LOW (ref 150–400)
RBC: 4.38 MIL/uL (ref 4.22–5.81)
RDW: 12.7 % (ref 11.5–15.5)
WBC: 4.6 10*3/uL (ref 4.0–10.5)
nRBC: 0 % (ref 0.0–0.2)

## 2021-09-18 NOTE — Progress Notes (Signed)
Pt has no new complaints/concerns at this time.

## 2021-09-18 NOTE — Progress Notes (Signed)
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  INTERVAL HISTORY: Mr. Wint is a 65 year old male who presents for every 63-monthfollow-up.  Patient follows up with Dr. MTami Ribasfor routine laryngoscopy she last had a few months back.  States he is doing well.  Denies any lymphadenopathy.  Continues to have a cough but states he can live with that.   REVIEW OF SYSTEMS:   Review of Systems  Constitutional: Negative.  Negative for chills, fever, malaise/fatigue and weight loss.  HENT:  Negative for congestion, ear pain and tinnitus.   Eyes: Negative.  Negative for blurred vision and double vision.  Respiratory:  Positive for cough. Negative for sputum production and shortness of breath.   Cardiovascular: Negative.  Negative for chest pain, palpitations and leg swelling.  Gastrointestinal: Negative.  Negative for abdominal pain, constipation, diarrhea, nausea and vomiting.  Genitourinary:  Negative for dysuria, frequency and urgency.  Musculoskeletal:  Negative for back pain and falls.  Skin: Negative.  Negative for rash.  Neurological: Negative.  Negative for weakness and headaches.  Endo/Heme/Allergies: Negative.  Does not bruise/bleed easily.  Psychiatric/Behavioral: Negative.  Negative for depression. The patient is not nervous/anxious and does not have insomnia.    As per HPI. Otherwise, a complete review of systems is negative.  PAST MEDICAL HISTORY: Past Medical History:  Diagnosis [place]   [place]     PAST SURGICAL HISTORY: Past Surgical History:  Procedure Laterality Date   ADENOIDECTOMY     INGUINAL  HERNIA REPAIR Left    INGUINAL LYMPH NODE BIOPSY Right    removal of lymph node   TONSILLECTOMY      FAMILY HISTORY: Family History  Problem Relation Age of Onset   Multiple myeloma Brother     ADVANCED DIRECTIVES (Y/N):  N  HEALTH MAINTENANCE: Social History   Tobacco Use   Smoking status: Never   Smokeless tobacco: Never  Vaping Use   Vaping Use: Never used  Substance Use Topics   Alcohol use: Not Currently   Drug use: Not Currently     Colonoscopy:  PAP:  Bone density:  Lipid panel:  No Known Allergies  Current Outpatient Medications  Medication Sig Dispense Refill   albuterol (VENTOLIN HFA) 108 (90 Base) MCG/ACT inhaler Inhale 2 puffs into the lungs every 6 (six) hours as needed for wheezing or shortness of breath. 8 g 2   chlorpheniramine (CHLOR-TRIMETON) 2 MG/5ML syrup Take 2 mg by mouth every 4 (four) hours as needed for allergies.     fexofenadine (ALLEGRA) 180 MG tablet Take 180 mg by mouth daily.     meloxicam (MOBIC) 15 MG tablet Take 15 mg by mouth daily.     No current facility-administered medications for this visit.    OBJECTIVE: Vitals:   09/18/21 1353  BP: 125/69  Pulse: 67  Resp: 16  Temp: 98.3 F (36.8 C)  SpO2: 98%     Body mass index is 32.07 kg/m.    ECOG FS:0 - Asymptomatic   Physical Exam Constitutional:      Appearance: Normal appearance.  HENT:     Head: Normocephalic and atraumatic.  Eyes:     Pupils: Pupils are  equal, round, and reactive to light.  Cardiovascular:     Rate and Rhythm: Normal rate and regular rhythm.     Heart sounds: Normal heart sounds. No murmur heard. Pulmonary:     Effort: Pulmonary effort is normal.     Breath sounds: Normal breath sounds. No wheezing.  Abdominal:     General: Bowel sounds are normal. There is no distension.     Palpations: Abdomen is soft.     Tenderness: There is no abdominal tenderness.  Musculoskeletal:        General: Normal range of motion.     Cervical back: Normal  range of motion.  Skin:    General: Skin is warm and dry.     Findings: No rash.  Neurological:     Mental Status: He is alert and oriented to person, place, and time.  Psychiatric:        Judgment: Judgment normal.       LAB RESULTS:  Lab Results  Component Value Date   NA 136 09/18/2021   K 4.3 09/18/2021   CL 104 09/18/2021   CO2 27 09/18/2021   GLUCOSE 101 (H) 09/18/2021   BUN 16 09/18/2021   CREATININE 1.02 09/18/2021   CALCIUM 8.6 (L) 09/18/2021   PROT 6.7 09/18/2021   ALBUMIN 4.0 09/18/2021   AST 20 09/18/2021   ALT 21 09/18/2021   ALKPHOS 52 09/18/2021   BILITOT 0.8 09/18/2021   GFRNONAA >60 09/18/2021   GFRAA >60 06/14/2020    Lab Results  Component Value Date   WBC 4.6 09/18/2021   NEUTROABS 3.0 09/18/2021   HGB 13.8 09/18/2021   HCT 39.9 09/18/2021   MCV 91.1 09/18/2021   PLT 135 (L) 09/18/2021     STUDIES: No results found.  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  PLAN:    1. 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  PET scan from 03/02/2020 showed improvement of hypermetabolic activity and reduction in size of known lesion.  CT scan from 09/12/2020 without evidence of recurrent or progressive disease.  He is followed by ENT Dr. Tami Ribas approximately every 4 months for laryngoscopy.  No additional imaging required unless suspicion of recurrence.  Return to clinic in 6 months with repeat labs and evaluation with Dr. Grayland Ormond.  2.  Cough: Chronic.  Supportive treatment.  Disposition: RTC in 6 months with repeat lab work and MD assessment.  I spent 15 minutes dedicated to the care of this patient (face-to-face and non-face-to-face) on the date of the encounter to include what is described in the assessment and plan.  Patient expressed understanding and was in agreement with this plan. He also understands that He can call clinic at any time with any  questions, concerns, or complaints.    Jacquelin Hawking, NP   09/18/2021 2:25 PM

## 2021-11-01 ENCOUNTER — Encounter: Payer: Self-pay | Admitting: Oncology

## 2021-12-20 ENCOUNTER — Ambulatory Visit: Payer: Medicare Other | Admitting: Radiation Oncology

## 2021-12-25 ENCOUNTER — Ambulatory Visit: Payer: Medicare Other | Admitting: Radiation Oncology

## 2022-03-20 ENCOUNTER — Ambulatory Visit: Payer: Medicare Other | Admitting: Oncology

## 2022-03-20 ENCOUNTER — Ambulatory Visit: Payer: Medicare Other | Admitting: Radiation Oncology

## 2022-03-20 ENCOUNTER — Other Ambulatory Visit: Payer: Medicare Other

## 2022-03-30 NOTE — Progress Notes (Signed)
?[place]  ?[phone] [phone] ? ?ID: Bryan Gomez OB: [birth year]  MR#: [id number]  AUQ#:[id number] ? ?Patient Care Team: ?Albina Billet, MD as PCP - General (Internal Medicine) ?Noreene Filbert, MD as Referring Physician (Radiation Oncology) ?Lloyd Huger, MD as Consulting Physician (Oncology) ? 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 ? ?INTERVAL HISTORY: Patient returns to clinic today for routine 86-monthevaluation.  He currently feels well and is asymptomatic.  He does not complain of pain, cough, or dysphagia today.  He denies any weakness or fatigue.  He has no neurologic complaints.  He denies any recent fevers or illnesses.  He has a fair appetite and denies weight loss.  He has no chest pain, shortness of breath, or hemoptysis.  He denies any nausea, vomiting, constipation, or diarrhea.  He has no urinary complaints.  Patient offers no specific complaints today. ? ?REVIEW OF SYSTEMS:   ?Review of Systems  ?Constitutional:  Negative for fever, malaise/fatigue and weight loss.  ?HENT: Negative.  Negative for sore throat.   ?Respiratory: Negative.  Negative for cough, hemoptysis and shortness of breath.   ?Cardiovascular: Negative.  Negative for chest pain and leg swelling.  ?Gastrointestinal: Negative.  Negative for abdominal pain and nausea.  ?Genitourinary: Negative.  Negative for dysuria.  ?Musculoskeletal: Negative.  Negative for back pain.  ?Skin: Negative.  Negative for rash.  ?Neurological: Negative.  Negative for dizziness, focal weakness, weakness and headaches.  ?Psychiatric/Behavioral: Negative.  The patient is not nervous/anxious.   ? ?As per HPI. Otherwise, a complete review of systems is negative. ? ?PAST MEDICAL HISTORY: ?Past Medical History:  ?Diagnosis Date  ? Cancer ([facility]   ? ? ?PAST SURGICAL HISTORY: ?Past Surgical History:  ?Procedure Laterality Date  ? ADENOIDECTOMY    ? INGUINAL HERNIA REPAIR Left   ?  INGUINAL LYMPH NODE BIOPSY Right   ? removal of lymph node  ? TONSILLECTOMY    ? ? ?FAMILY HISTORY: ?Family History  ?Problem Relation Age of Onset  ? Multiple myeloma Brother   ? ? ?ADVANCED DIRECTIVES (Y/N):  N ? ?HEALTH MAINTENANCE: ?Social History  ? ?Tobacco Use  ? Smoking status: Never  ? Smokeless tobacco: Never  ?Vaping Use  ? Vaping Use: Never used  ?Substance Use Topics  ? Alcohol use: Not Currently  ? Drug use: Not Currently  ? ? ? Colonoscopy: ? PAP: ? Bone density: ? Lipid panel: ? ?No Known Allergies ? ?Current Outpatient Medications  ?Medication Sig Dispense Refill  ? albuterol (VENTOLIN HFA) 108 (90 Base) MCG/ACT inhaler Inhale 2 puffs into the lungs every 6 (six) hours as needed for wheezing or shortness of breath. 8 g 2  ? chlorpheniramine (CHLOR-TRIMETON) 2 MG/5ML syrup Take 2 mg by mouth every 4 (four) hours as needed for allergies.    ? meloxicam (MOBIC) 15 MG tablet Take 15 mg by mouth daily.    ? ?No current facility-administered medications for this visit.  ? ? ?OBJECTIVE: ?Vitals:  ? 04/03/22 0854  ?BP: 139/79  ?Pulse: (!) 59  ?Resp: 16  ?Temp: (!) 97.5 ?F (36.4 ?C)  ?SpO2: 99%  ?   Body mass index is 32.74 kg/m?.Marland Kitchen   ECOG FS:0 - Asymptomatic ? ?General: Well-developed, well-nourished, no acute distress. ?Eyes: Pink conjunctiva, anicteric sclera. ?HEENT: Normocephalic, moist mucous membranes. ?Lungs: No audible wheezing or coughing. ?Heart: Regular rate and rhythm. ?Abdomen: Soft, nontender, no obvious distention. ?Musculoskeletal: No edema, cyanosis, or clubbing. ?Neuro: Alert, answering  all questions appropriately. Cranial nerves grossly intact. ?Skin: No rashes or petechiae noted. ?Psych: Normal affect. ? ? ?LAB RESULTS: ? ?Lab Results  ?Component Value Date  ? NA 137 04/03/2022  ? K 4.1 04/03/2022  ? CL 105 04/03/2022  ? CO2 27 04/03/2022  ? GLUCOSE 99 04/03/2022  ? BUN 18 04/03/2022  ? CREATININE 1.16 04/03/2022  ? CALCIUM 9.0 04/03/2022  ? PROT 6.5 04/03/2022  ? ALBUMIN 3.9 04/03/2022   ? AST 17 04/03/2022  ? ALT 18 04/03/2022  ? ALKPHOS 54 04/03/2022  ? BILITOT 0.8 04/03/2022  ? GFRNONAA >60 04/03/2022  ? GFRAA >60 06/14/2020  ? ? ?Lab Results  ?Component Value Date  ? WBC 4.8 04/03/2022  ? NEUTROABS 3.0 04/03/2022  ? HGB 15.0 04/03/2022  ? HCT 43.0 04/03/2022  ? MCV 90.1 04/03/2022  ? PLT 143 (L) 04/03/2022  ? ? ? ?STUDIES: ?No results found. ? ? 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 ? ?PLAN:   ? ?1. 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  He completed XRT on December 31, 2019.  PET scan results from March 02, 2020 reviewed independently with marked improvement of hypermetabolic activity and reduction in size of known lesion.  CT scan results from September 12, 2020 reviewed independently and report as above with no obvious evidence of recurrent or progressive disease.  He also reports a normal endoscopy by ENT recently.  No intervention is needed at this time.  Patient does not require any further imaging unless there is suspicion of recurrence.  Return to clinic in 6 months for routine evaluation.   ?2.  Thrombocytopenia: Improved and nearly resolved.   ?3.  Cough: Patient does not complain of this today. ? ? ?Patient expressed understanding and was in agreement with this plan. He also understands that He can call clinic at any time with any questions, concerns, or complaints.  ? ? ?Lloyd Huger, MD   04/03/2022 11:02 AM ? ? ? ? ?

## 2022-04-02 ENCOUNTER — Other Ambulatory Visit: Payer: Self-pay | Admitting: Emergency Medicine

## 2022-04-02 DIAGNOSIS — C099 Malignant neoplasm of tonsil, unspecified: Secondary | ICD-10-CM

## 2022-04-03 ENCOUNTER — Encounter: Payer: Self-pay | Admitting: Oncology

## 2022-04-03 ENCOUNTER — Inpatient Hospital Stay: Payer: Medicare Other | Attending: Oncology

## 2022-04-03 ENCOUNTER — Ambulatory Visit
Admission: RE | Admit: 2022-04-03 | Discharge: 2022-04-03 | Disposition: A | Discharge status: Home / Self Care | Payer: Medicare Other | Source: Ambulatory Visit | Attending: Radiation Oncology | Admitting: Radiation Oncology

## 2022-04-03 ENCOUNTER — Inpatient Hospital Stay (HOSPITAL_BASED_OUTPATIENT_CLINIC_OR_DEPARTMENT_OTHER): Payer: Medicare Other | Admitting: Oncology

## 2022-04-03 VITALS — BP 139/79 | HR 59 | Temp 97.5°F | Resp 16 | Ht 74.0 in | Wt 255.0 lb

## 2022-04-03 DIAGNOSIS — Z85819 Personal history of malignant neoplasm of unspecified site of lip, oral cavity, and pharynx: Secondary | ICD-10-CM | POA: Insufficient documentation

## 2022-04-03 DIAGNOSIS — Z923 Personal history of irradiation: Secondary | ICD-10-CM | POA: Insufficient documentation

## 2022-04-03 DIAGNOSIS — D696 Thrombocytopenia, unspecified: Secondary | ICD-10-CM | POA: Insufficient documentation

## 2022-04-03 DIAGNOSIS — C099 Malignant neoplasm of tonsil, unspecified: Secondary | ICD-10-CM | POA: Diagnosis not present

## 2022-04-03 DIAGNOSIS — Z9221 Personal history of antineoplastic chemotherapy: Secondary | ICD-10-CM | POA: Insufficient documentation

## 2022-04-03 DIAGNOSIS — Z85818 Personal history of malignant neoplasm of other sites of lip, oral cavity, and pharynx: Secondary | ICD-10-CM | POA: Insufficient documentation

## 2022-04-03 LAB — CBC WITH DIFFERENTIAL/PLATELET
Abs Immature Granulocytes: 0.02 10*3/uL (ref 0.00–0.07)
Basophils Absolute: 0 10*3/uL (ref 0.0–0.1)
Basophils Relative: 1 %
Eosinophils Absolute: 0.2 10*3/uL (ref 0.0–0.5)
Eosinophils Relative: 4 %
HCT: 43 % (ref 39.0–52.0)
Hemoglobin: 15 g/dL (ref 13.0–17.0)
Immature Granulocytes: 0 %
Lymphocytes Relative: 21 %
Lymphs Abs: 1 10*3/uL (ref 0.7–4.0)
MCH: 31.4 pg (ref 26.0–34.0)
MCHC: 34.9 g/dL (ref 30.0–36.0)
MCV: 90.1 fL (ref 80.0–100.0)
Monocytes Absolute: 0.5 10*3/uL (ref 0.1–1.0)
Monocytes Relative: 10 %
Neutro Abs: 3 10*3/uL (ref 1.7–7.7)
Neutrophils Relative %: 64 %
Platelets: 143 10*3/uL — ABNORMAL LOW (ref 150–400)
RBC: 4.77 MIL/uL (ref 4.22–5.81)
RDW: 13.2 % (ref 11.5–15.5)
WBC: 4.8 10*3/uL (ref 4.0–10.5)
nRBC: 0 % (ref 0.0–0.2)

## 2022-04-03 LAB — COMPREHENSIVE METABOLIC PANEL
ALT: 18 U/L (ref 0–44)
AST: 17 U/L (ref 15–41)
Albumin: 3.9 g/dL (ref 3.5–5.0)
Alkaline Phosphatase: 54 U/L (ref 38–126)
Anion gap: 5 (ref 5–15)
BUN: 18 mg/dL (ref 8–23)
CO2: 27 mmol/L (ref 22–32)
Calcium: 9 mg/dL (ref 8.9–10.3)
Chloride: 105 mmol/L (ref 98–111)
Creatinine, Ser: 1.16 mg/dL (ref 0.61–1.24)
GFR, Estimated: >60 mL/min (ref >60)
Glucose, Bld: 99 mg/dL (ref 70–99)
Potassium: 4.1 mmol/L (ref 3.5–5.1)
Sodium: 137 mmol/L (ref 135–145)
Total Bilirubin: 0.8 mg/dL (ref 0.3–1.2)
Total Protein: 6.5 g/dL (ref 6.5–8.1)

## 2022-04-03 NOTE — Progress Notes (Signed)
Radiation Oncology ?Follow up Note ? ?Name: Bryan Gomez   ?Date:   04/03/2022 ?MRN:  [id number] ?DOB: May 11, 1956  ? ? ?This 66 y.o. 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  Seen today in routine follow-up he is doing well.  He specifically denies any head and neck pain or dysphagia.  He is under routine surveillance by ENT showing no evidence of disease.  His only follow-up CT scan was approximately year ago showing no evidence of disease.. ? ?COMPLICATIONS OF TREATMENT: none ? ?FOLLOW UP COMPLIANCE: keeps appointments  ? ?PHYSICAL EXAM:  ?There were no vitals taken for this visit. ?No evidence of cervical adenopathy is noted.  Well-developed well-nourished patient in NAD. HEENT reveals PERLA, EOMI, discs not visualized.  Oral cavity is clear. No oral mucosal lesions are identified. Neck is clear without evidence of cervical or supraclavicular adenopathy. Lungs are clear to A&P. Cardiac examination is essentially unremarkable with regular rate and rhythm without murmur rub or thrill. Abdomen is benign with no organomegaly or masses noted. Motor sensory and DTR levels are equal and symmetric in the upper and lower extremities. Cranial nerves II through XII are grossly intact. Proprioception is intact. No peripheral adenopathy or edema is identified. No motor or sensory levels are noted. Crude visual fields are within normal range. ? ?RADIOLOGY RESULTS: No current films to review  ? ?PLAN: Present time patient continues to do well now close to 3 years with no evidence of disease.  I am going to turn follow-up care over to ENT.  To be happy to reevaluate the patient anytime should further  follow-up be needed. ? ?I would like to take this opportunity to thank you for allowing me to participate in the care of your patient.. ?  ? Noreene Filbert, MD ? ?

## 2022-04-03 NOTE — Addendum Note (Signed)
Addended by: Wilford Corner on: 04/03/2022 11:20 AM ? ? Modules accepted: Orders ? ?

## 2022-05-10 ENCOUNTER — Encounter: Payer: Self-pay | Admitting: Oncology

## 2022-05-10 ENCOUNTER — Encounter: Payer: Self-pay | Admitting: Urology

## 2022-05-10 ENCOUNTER — Ambulatory Visit (INDEPENDENT_AMBULATORY_CARE_PROVIDER_SITE_OTHER): Payer: Medicare Other | Admitting: Urology

## 2022-05-10 VITALS — BP 157/83 | HR 64 | Ht 74.0 in | Wt 250.0 lb

## 2022-05-10 DIAGNOSIS — N529 Male erectile dysfunction, unspecified: Secondary | ICD-10-CM | POA: Diagnosis not present

## 2022-05-10 MED ORDER — AMBULATORY NON FORMULARY MEDICATION: 0 refills | Status: DC

## 2022-05-10 NOTE — Progress Notes (Signed)
05/10/2022 6:36 AM   [name] [birth year] [id number]  Referring provider:  Albina Billet, MD [address]   [place],  [place] [address] No chief complaint on file.     HPI: Bryan Gomez is a 66 y.o.male who presents today for further evaluation of ED.   He reports that he has had ED for 5 years. He has tried PDE 5 Inhibitors including cialis and Viagra. He was on the high dose of both cialis and Viagra. He reports that is libido has lowered.      PMH: Past Medical History:  Diagnosis Date   Cancer [facility])     Surgical History: Past Surgical History:  Procedure Laterality Date   ADENOIDECTOMY     INGUINAL HERNIA REPAIR Left    INGUINAL LYMPH NODE BIOPSY Right    removal of lymph node   TONSILLECTOMY      Home Medications:  Allergies as of 05/10/2022   No Known Allergies      Medication List        Accurate as of May 10, 2022  6:36 AM. If you have any questions, ask your nurse or doctor.          albuterol 108 (90 Base) MCG/ACT inhaler Commonly known as: VENTOLIN HFA Inhale 2 puffs into the lungs every 6 (six) hours as needed for wheezing or shortness of breath.   chlorpheniramine 2 MG/5ML syrup Commonly known as: CHLOR-TRIMETON Take 2 mg by mouth every 4 (four) hours as needed for allergies.   meloxicam 15 MG tablet Commonly known as: MOBIC Take 15 mg by mouth daily.        Allergies:  No Known Allergies  Family History: Family History  Problem Relation Age of Onset   Multiple myeloma Brother     Social History:  reports that he has never smoked. He has never used smokeless tobacco. He reports that he does not currently use alcohol. He reports that he does not currently use drugs.   Physical Exam: There were no vitals taken for this visit.  Constitutional:  Alert and oriented, No acute distress. HEENT: [place] AT, moist mucus membranes.  Trachea midline, no masses. Cardiovascular: No clubbing, cyanosis, or edema. Respiratory:  Normal respiratory effort, no increased work of breathing. Skin: No rashes, bruises or suspicious lesions. Neurologic: Grossly intact, no focal deficits, moving all 4 extremities. Psychiatric: Normal mood and affect.  Laboratory Data: Lab Results  Component Value Date   CREATININE 1.16 04/03/2022   No results found for: HGBA1C  Assessment & Plan:   Erectile dysfunction  - We discussed the pathophysiology of erectile dysfunction today along with possible contributing factors. Discussed possible treatment options including PDE 5 inhibitors, vacuum erectile device, intracavernosal injection, MUSE, and placement of the inflatable or malleable penile prosthesis for refractory cases.  In terms of PDE 5 inhibitors, we discussed contraindications for this medication as well as common side effects. Patient was counseled on optimal use. All of his questions were answered in detail. - He is interested in Trimix today  - Will have him follow-up with PA for titration and injection teaching  - Trimix; prescribed    No follow-ups on file.  I,Kailey Littlejohn,acting as a Education administrator for Hollice Espy, MD.,have documented all relevant documentation on the behalf of Hollice Espy, MD,as directed by  Hollice Espy, MD while in the presence of Hollice Espy, [place] [address] [place],  [address] [phone]

## 2022-05-12 LAB — TESTOSTERONE: Testosterone: 459 ng/dL (ref 264–916)

## 2022-06-14 ENCOUNTER — Ambulatory Visit: Payer: Medicare Other | Admitting: Urology

## 2022-06-25 NOTE — Progress Notes (Signed)
   06/26/2022 8:28 AM  Bryan Gomez November 23, 1956 [id number]   Referring provider: Jaclyn Shaggy, MD [address]   [place],  [place] [address]  Urological history: 1.  Erectile dysfunction -Contributing factors of age and BPH -testosterone, 05/2022 - 459 -failed PDE5i's   Chief Complaint  Patient presents with   Erectile Dysfunction    HPI: Bryan Gomez is a 66 y.o. male who presents today for ICI titration.   Unfortunately, he did not bring in the Trimix for [place] to begin the titration.    Physical Exam: BP (!) 143/74   Pulse 67   Ht 6\' 2"  (1.88 m)   Wt 250 lb (113.4 kg)   BMI 32.10 kg/m   Constitutional:  Well nourished. Alert and oriented, No acute distress. Psychiatric: Normal mood and affect.   Assessment & Plan:    1. ED -I explained in detail to the patient the flow of the titration appointment and what to expect during the visit -He stated his wife is having issues with recurrent urinary tract infection and has been diagnosed with multiple myeloma, so he states that at this time he is not quite ready to be sexually active   Return if symptoms worsen or fail to improve.  [name]  [facility] [address] [place], [place] [address] [phone]  I spent 15 minutes on the day of the encounter to include pre-visit record review, face-to-face time with the patient, and post-visit ordering of tests.

## 2022-06-26 ENCOUNTER — Encounter: Payer: Self-pay | Admitting: Urology

## 2022-06-26 ENCOUNTER — Ambulatory Visit (INDEPENDENT_AMBULATORY_CARE_PROVIDER_SITE_OTHER): Payer: Medicare Other | Admitting: Urology

## 2022-06-26 VITALS — BP 143/74 | HR 67 | Ht 74.0 in | Wt 250.0 lb

## 2022-06-26 DIAGNOSIS — N529 Male erectile dysfunction, unspecified: Secondary | ICD-10-CM

## 2022-07-12 ENCOUNTER — Encounter: Payer: Self-pay | Admitting: Oncology

## 2022-07-16 ENCOUNTER — Other Ambulatory Visit: Payer: Self-pay | Admitting: Internal Medicine

## 2022-07-16 DIAGNOSIS — C099 Malignant neoplasm of tonsil, unspecified: Secondary | ICD-10-CM

## 2022-07-17 ENCOUNTER — Ambulatory Visit
Admission: RE | Admit: 2022-07-17 | Discharge: 2022-07-17 | Disposition: A | Discharge status: Home / Self Care | Payer: Medicare Other | Attending: Internal Medicine | Admitting: Internal Medicine

## 2022-07-17 ENCOUNTER — Ambulatory Visit
Admission: RE | Admit: 2022-07-17 | Discharge: 2022-07-17 | Disposition: A | Discharge status: Home / Self Care | Payer: Medicare Other | Source: Ambulatory Visit | Attending: Internal Medicine | Admitting: Internal Medicine

## 2022-07-17 DIAGNOSIS — C099 Malignant neoplasm of tonsil, unspecified: Secondary | ICD-10-CM | POA: Diagnosis present

## 2022-10-02 ENCOUNTER — Encounter: Payer: Self-pay | Admitting: Oncology

## 2022-10-02 ENCOUNTER — Inpatient Hospital Stay (HOSPITAL_BASED_OUTPATIENT_CLINIC_OR_DEPARTMENT_OTHER): Payer: Medicare Other | Admitting: Oncology

## 2022-10-02 ENCOUNTER — Inpatient Hospital Stay: Payer: Medicare Other | Attending: Oncology

## 2022-10-02 VITALS — BP 131/83 | HR 59 | Temp 99.2°F | Resp 16 | Ht 74.0 in | Wt 245.2 lb

## 2022-10-02 DIAGNOSIS — Z923 Personal history of irradiation: Secondary | ICD-10-CM | POA: Insufficient documentation

## 2022-10-02 DIAGNOSIS — C099 Malignant neoplasm of tonsil, unspecified: Secondary | ICD-10-CM

## 2022-10-02 LAB — COMPREHENSIVE METABOLIC PANEL
ALT: 19 U/L (ref 0–44)
AST: 20 U/L (ref 15–41)
Albumin: 3.7 g/dL (ref 3.5–5.0)
Alkaline Phosphatase: 58 U/L (ref 38–126)
Anion gap: 4 — ABNORMAL LOW (ref 5–15)
BUN: 16 mg/dL (ref 8–23)
CO2: 26 mmol/L (ref 22–32)
Calcium: 8.8 mg/dL — ABNORMAL LOW (ref 8.9–10.3)
Chloride: 109 mmol/L (ref 98–111)
Creatinine, Ser: 0.89 mg/dL (ref 0.61–1.24)
GFR, Estimated: >60 mL/min (ref >60)
Glucose, Bld: 103 mg/dL — ABNORMAL HIGH (ref 70–99)
Potassium: 4 mmol/L (ref 3.5–5.1)
Sodium: 139 mmol/L (ref 135–145)
Total Bilirubin: 0.6 mg/dL (ref 0.3–1.2)
Total Protein: 6.4 g/dL — ABNORMAL LOW (ref 6.5–8.1)

## 2022-10-02 LAB — CBC WITH DIFFERENTIAL/PLATELET
Abs Immature Granulocytes: 0.03 10*3/uL (ref 0.00–0.07)
Basophils Absolute: 0 10*3/uL (ref 0.0–0.1)
Basophils Relative: 1 %
Eosinophils Absolute: 0.2 10*3/uL (ref 0.0–0.5)
Eosinophils Relative: 3 %
HCT: 40.7 % (ref 39.0–52.0)
Hemoglobin: 13.9 g/dL (ref 13.0–17.0)
Immature Granulocytes: 1 %
Lymphocytes Relative: 19 %
Lymphs Abs: 0.8 10*3/uL (ref 0.7–4.0)
MCH: 31.1 pg (ref 26.0–34.0)
MCHC: 34.2 g/dL (ref 30.0–36.0)
MCV: 91.1 fL (ref 80.0–100.0)
Monocytes Absolute: 0.6 10*3/uL (ref 0.1–1.0)
Monocytes Relative: 13 %
Neutro Abs: 2.8 10*3/uL (ref 1.7–7.7)
Neutrophils Relative %: 63 %
Platelets: 153 10*3/uL (ref 150–400)
RBC: 4.47 MIL/uL (ref 4.22–5.81)
RDW: 13.2 % (ref 11.5–15.5)
WBC: 4.4 10*3/uL (ref 4.0–10.5)
nRBC: 0 % (ref 0.0–0.2)

## 2022-10-02 NOTE — Progress Notes (Signed)
[place]  [phone] [phone]  ID: [name] OB: 1956-09-19  MR#: [id number]  FUX#:[id number]  Patient Care Team: Albina Billet, MD as PCP - General (Internal Medicine) Noreene Filbert, MD as Referring Physician (Radiation Oncology) Lloyd Huger, MD as Consulting Physician (Oncology)  CHIEF COMPLAINT: Stage IVa squamous cell carcinoma of the right tonsil.  INTERVAL HISTORY: Patient returns to clinic today for routine 18-monthevaluation.  He continues to feel well and remains asymptomatic.  He reports a normal endoscopy by ENT several weeks ago.  He does not complain of pain, cough, or dysphagia today.  He denies any weakness or fatigue.  He has no neurologic complaints.  He denies any recent fevers or illnesses.  He has a fair appetite and denies weight loss.  He has no chest pain, shortness of breath, or hemoptysis.  He denies any nausea, vomiting, constipation, or diarrhea.  He has no urinary complaints.  Patient offers no specific complaints today.  REVIEW OF SYSTEMS:   Review of Systems  Constitutional:  Negative for fever, malaise/fatigue and weight loss.  HENT: Negative.  Negative for sore throat.   Respiratory: Negative.  Negative for cough, hemoptysis and shortness of breath.   Cardiovascular: Negative.  Negative for chest pain and leg swelling.  Gastrointestinal: Negative.  Negative for abdominal pain and nausea.  Genitourinary: Negative.  Negative for dysuria.  Musculoskeletal: Negative.  Negative for back pain.  Skin: Negative.  Negative for rash.  Neurological: Negative.  Negative for dizziness, focal weakness, weakness and headaches.  Psychiatric/Behavioral: Negative.  The patient is not nervous/anxious.     As per HPI. Otherwise, a complete review of systems is negative.  PAST MEDICAL HISTORY: Past Medical History:  Diagnosis Date   [facility]     PAST SURGICAL HISTORY: Past Surgical History:  Procedure  Laterality Date   ADENOIDECTOMY     INGUINAL HERNIA REPAIR Left    INGUINAL LYMPH NODE BIOPSY Right    removal of lymph node   TONSILLECTOMY      FAMILY HISTORY: Family History  Problem Relation Age of Onset   Multiple myeloma Brother     ADVANCED DIRECTIVES (Y/N):  N  HEALTH MAINTENANCE: Social History   Tobacco Use   Smoking status: Never   Smokeless tobacco: Never  Vaping Use   Vaping Use: Never used  Substance Use Topics   Alcohol use: Not Currently   Drug use: Not Currently     Colonoscopy:  PAP:  Bone density:  Lipid panel:  No Known Allergies  Current Outpatient Medications  Medication Sig Dispense Refill   albuterol (VENTOLIN HFA) 108 (90 Base) MCG/ACT inhaler Inhale 2 puffs into the lungs every 6 (six) hours as needed for wheezing or shortness of breath. 8 g 2   AMBULATORY NON FORMULARY MEDICATION Trimix (30/1/10)-(Pap/Phent/PGE)  Test Dose  118mvial   Qty #3 Refills 0  [place] 9562ax [phone] vial 0   chlorpheniramine (CHLOR-TRIMETON) 2 MG/5ML syrup Take 2 mg by mouth every 4 (four) hours as needed for allergies.     meloxicam (MOBIC) 15 MG tablet Take 15 mg by mouth daily.     No current facility-administered medications for this visit.    OBJECTIVE: Vitals:   10/02/22 1000  BP: 131/83  Pulse: (!) 59  Resp: 16  Temp: 99.2 F (37.3 C)  SpO2: 99%     Body mass index is 31.48 kg/m.    ECOG FS:0 - Asymptomatic  General: Well-developed, well-nourished, no acute distress. Eyes: Pink conjunctiva, anicteric sclera. HEENT: Normocephalic, moist mucous membranes. Lungs: No audible wheezing or coughing. Heart: Regular rate and rhythm. Abdomen: Soft, nontender, no obvious distention. Musculoskeletal: No edema, cyanosis, or clubbing. Neuro: Alert, answering all questions appropriately. Cranial nerves grossly intact. Skin: No rashes or petechiae noted. Psych: Normal affect.  LAB RESULTS:  Lab Results  Component  Value Date   NA 139 10/02/2022   K 4.0 10/02/2022   CL 109 10/02/2022   CO2 26 10/02/2022   GLUCOSE 103 (H) 10/02/2022   BUN 16 10/02/2022   CREATININE 0.89 10/02/2022   CALCIUM 8.8 (L) 10/02/2022   PROT 6.4 (L) 10/02/2022   ALBUMIN 3.7 10/02/2022   AST 20 10/02/2022   ALT 19 10/02/2022   ALKPHOS 58 10/02/2022   BILITOT 0.6 10/02/2022   GFRNONAA >60 10/02/2022   GFRAA >60 06/14/2020    Lab Results  Component Value Date   WBC 4.4 10/02/2022   NEUTROABS 2.8 10/02/2022   HGB 13.9 10/02/2022   HCT 40.7 10/02/2022   MCV 91.1 10/02/2022   PLT 153 10/02/2022     STUDIES: No results found.   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  PLAN:    1. 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  He completed XRT on December 31, 2019.  PET scan results from March 02, 2020 reviewed independently with marked improvement of hypermetabolic activity and reduction in size of known lesion.  CT scan results from September 12, 2020 reviewed independently and report as above with no obvious evidence of recurrent or progressive disease.  No further imaging is necessary.  Patient reports a normal endoscopy by ENT several weeks ago.  No intervention is needed at this time.  Return to clinic in 1 year for routine evaluation.  Once patient is 5 years removed from completing his treatments, he can be discharged from clinic.   2.  Thrombocytopenia: Resolved.   Patient expressed understanding and was in agreement with this plan. He also understands that He can call clinic at any time with any questions, concerns, or complaints.    Lloyd Huger, MD   10/02/2022 11:48 AM

## 2023-04-24 ENCOUNTER — Other Ambulatory Visit: Payer: Self-pay | Admitting: Hematology and Oncology

## 2023-10-01 ENCOUNTER — Institutional Professional Consult (permissible substitution): Payer: PRIVATE HEALTH INSURANCE | Admitting: Radiation Oncology

## 2023-10-02 ENCOUNTER — Other Ambulatory Visit: Payer: Self-pay

## 2023-10-02 DIAGNOSIS — C099 Malignant neoplasm of tonsil, unspecified: Secondary | ICD-10-CM

## 2023-10-03 ENCOUNTER — Inpatient Hospital Stay: Payer: Medicare PPO | Attending: Oncology

## 2023-10-03 ENCOUNTER — Inpatient Hospital Stay: Payer: Medicare PPO | Admitting: Oncology

## 2023-11-25 ENCOUNTER — Ambulatory Visit
Admission: RE | Admit: 2023-11-25 | Discharge: 2023-11-25 | Disposition: A | Discharge status: Home / Self Care | Payer: Medicare PPO | Attending: Internal Medicine | Admitting: Internal Medicine

## 2023-11-25 ENCOUNTER — Other Ambulatory Visit: Payer: Self-pay | Admitting: Internal Medicine

## 2023-11-25 ENCOUNTER — Ambulatory Visit
Admission: RE | Admit: 2023-11-25 | Discharge: 2023-11-25 | Disposition: A | Discharge status: Home / Self Care | Payer: Medicare PPO | Source: Ambulatory Visit | Attending: Internal Medicine | Admitting: Internal Medicine

## 2023-11-25 DIAGNOSIS — R059 Cough, unspecified: Secondary | ICD-10-CM

## 2024-02-26 ENCOUNTER — Encounter
Admission: RE | Disposition: A | Discharge status: Home / Self Care | Payer: Self-pay | Source: Home / Self Care | Attending: Surgery

## 2024-02-26 ENCOUNTER — Ambulatory Visit
Admission: RE | Admit: 2024-02-26 | Discharge: 2024-02-26 | Disposition: A | Discharge status: Home / Self Care | Payer: Medicare PPO | Attending: Surgery | Admitting: Surgery

## 2024-02-26 ENCOUNTER — Encounter: Payer: Self-pay | Admitting: Surgery

## 2024-02-26 ENCOUNTER — Other Ambulatory Visit: Payer: Self-pay | Admitting: Family

## 2024-02-26 ENCOUNTER — Ambulatory Visit: Admitting: Anesthesiology

## 2024-02-26 DIAGNOSIS — K641 Second degree hemorrhoids: Secondary | ICD-10-CM | POA: Insufficient documentation

## 2024-02-26 DIAGNOSIS — D120 Benign neoplasm of cecum: Secondary | ICD-10-CM | POA: Insufficient documentation

## 2024-02-26 DIAGNOSIS — K5730 Diverticulosis of large intestine without perforation or abscess without bleeding: Secondary | ICD-10-CM | POA: Diagnosis not present

## 2024-02-26 DIAGNOSIS — Z1211 Encounter for screening for malignant neoplasm of colon: Secondary | ICD-10-CM | POA: Insufficient documentation

## 2024-02-26 HISTORY — PX: COLONOSCOPY WITH PROPOFOL: SHX5780

## 2024-02-26 HISTORY — PX: POLYPECTOMY: SHX5525

## 2024-02-26 SURGERY — COLONOSCOPY WITH PROPOFOL
Anesthesia: General

## 2024-02-26 MED ORDER — GLYCOPYRROLATE 0.2 MG/ML IJ SOLN
INTRAMUSCULAR | Status: AC
Start: 2024-02-26 — End: ?
  Filled 2024-02-26: qty 1

## 2024-02-26 MED ORDER — PROPOFOL 1000 MG/100ML IV EMUL
INTRAVENOUS | Status: AC
  Filled 2024-02-26: qty 100

## 2024-02-26 MED ORDER — LIDOCAINE HCL (PF) 2 % IJ SOLN
INTRAMUSCULAR | Status: AC
  Filled 2024-02-26: qty 5

## 2024-02-26 MED ORDER — PROPOFOL 10 MG/ML IV BOLUS
INTRAVENOUS | Status: DC | PRN
  Administered 2024-02-26: 100 ug/kg/min via INTRAVENOUS
  Administered 2024-02-26: 60 mg via INTRAVENOUS
  Administered 2024-02-26: 40 mg via INTRAVENOUS

## 2024-02-26 MED ORDER — SODIUM CHLORIDE 0.9 % IV SOLN: INTRAVENOUS | Status: DC

## 2024-02-26 NOTE — Anesthesia Preprocedure Evaluation (Signed)
 Anesthesia Evaluation  Patient identified by MRN, date of birth, ID band Patient awake    Reviewed: Allergy & Precautions, NPO status , Patient's Chart, lab work & pertinent test results  History of Anesthesia Complications Negative for: history of anesthetic complications  Airway Mallampati: III  TM Distance: <3 FB Neck ROM: full    Dental  (+) Chipped   Pulmonary neg pulmonary ROS, neg shortness of breath   Pulmonary exam normal        Cardiovascular (-) angina negative cardio ROS Normal cardiovascular exam     Neuro/Psych negative neurological ROS  negative psych ROS   GI/Hepatic negative GI ROS, Neg liver ROS,neg GERD  ,,  Endo/Other  negative endocrine ROS    Renal/GU negative Renal ROS  negative genitourinary   Musculoskeletal   Abdominal   Peds  Hematology negative hematology ROS (+)   Anesthesia Other Findings Past Medical History: No date: Cancer (HCC)     Comment:  post HPV  Past Surgical History: No date: ADENOIDECTOMY No date: INGUINAL HERNIA REPAIR; Left No date: INGUINAL LYMPH NODE BIOPSY; Right     Comment:  removal of lymph node No date: TONSILLECTOMY  BMI    Body Mass Index: 29.92 kg/m      Reproductive/Obstetrics negative OB ROS                             Anesthesia Physical Anesthesia Plan  ASA: 2  Anesthesia Plan: General   Post-op Pain Management:    Induction: Intravenous  PONV Risk Score and Plan: Propofol infusion and TIVA  Airway Management Planned: Natural Airway and Nasal Cannula  Additional Equipment:   Intra-op Plan:   Post-operative Plan:   Informed Consent: I have reviewed the patients History and Physical, chart, labs and discussed the procedure including the risks, benefits and alternatives for the proposed anesthesia with the patient or authorized representative who has indicated his/her understanding and acceptance.      Dental Advisory Given  Plan Discussed with: Anesthesiologist, CRNA and Surgeon  Anesthesia Plan Comments: (Patient consented for risks of anesthesia including but not limited to:  - adverse reactions to medications - risk of airway placement if required - damage to eyes, teeth, lips or other oral mucosa - nerve damage due to positioning  - sore throat or hoarseness - Damage to heart, brain, nerves, lungs, other parts of body or loss of life  Patient voiced understanding and assent.)       Anesthesia Quick Evaluation

## 2024-02-26 NOTE — H&P (Signed)
 Subjective:   CC: Encounter for screening colonoscopy [Z12.11]  HPI: Bryan Gomez is a 68 y.o. male who was referred by Jaclyn Shaggy, MD for evaluation of above. No issues. Last cscope in 2008. Unsure what dx was or when they recommended next f/u should be since he never received a phone call  Past Medical History: has no past medical history on file.  Past Surgical History: has a past surgical history that includes Tonsillectomy; Adenoidectomy; Hernia repair; and biopsy lymph node superficial.  Family History: family history includes Benign prostatic hyperplasia in his father; Diabetes in his mother.  Social History: reports that he has never smoked. He has never used smokeless tobacco. He reports current alcohol use. He reports that he does not use drugs.  Current Medications: has a current medication list which includes the following prescription(s): chlorpheniramine, loratadine, and meloxicam.  Allergies:  No Known Allergies  ROS:  A 15 point review of systems was performed and pertinent positives and negatives noted in HPI  Objective:    BP (!) 146/74  Pulse 66  Ht 188 cm (6\' 2" )  Wt (!) 108.9 kg (240 lb)  BMI 30.81 kg/m   Constitutional : No distress, cooperative, alert  Lymphatics/Throat: Supple with no lymphadenopathy  Respiratory: Clear to auscultation bilaterally  Cardiovascular: Regular rate and rhythm  Gastrointestinal: Soft, non-tender, non-distended, no organomegaly.  Musculoskeletal: Steady gait and movement  Skin: Cool and moist  Psychiatric: Normal affect, non-agitated, not confused     LABS:  N/a   RADS: N/a  Assessment:    Encounter for screening colonoscopy [Z12.11]  Plan:    1. Encounter for screening colonoscopy [Z12.11] R/b/a discussed. Risks include bleeding, perforation. Benefits include diagnostic, curative procedure if needed. Alternatives include continued observation. Pt verbalized understanding.  2. Patient has elected to  proceed. Procedure will be scheduled.  labs/images/medications/previous chart entries reviewed personally and relevant changes/updates noted above.

## 2024-02-26 NOTE — Op Note (Signed)
 [facility] Gastroenterology Patient Name: Bryan Gomez Procedure Date: 02/26/2024 7:13 AM MRN: [id number] Account #: [id number] Date of Birth: [birth year] Admit Type: Outpatient Age: 68 Room: [facility] ENDO ROOM 1 Gender: Male Note Status: Finalized Instrument Name: Prentice Docker [id number] Procedure:             Colonoscopy Indications:           Screening for colorectal malignant neoplasm Providers:             Sung Amabile MD, MD Referring MD:          Jillene Bucks. Arlana Pouch, MD (Referring MD) Medicines:             Propofol per Anesthesia Complications:         No immediate complications. Procedure:             Pre-Anesthesia Assessment:                        - After reviewing the risks and benefits, the patient                         was deemed in satisfactory condition to undergo the                         procedure in an ambulatory setting.                        After obtaining informed consent, the colonoscope was                         passed under direct vision. Throughout the procedure,                         the patient's blood pressure, pulse, and oxygen                         saturations were monitored continuously. The                         Colonoscope was introduced through the anus and                         advanced to the the cecum, identified by the ileocecal                         valve. The colonoscopy was performed with ease. The                         patient tolerated the procedure well. The quality of                         the bowel preparation was good. Findings:      The perianal and digital rectal examinations were normal.      Two sessile polyps were found in the cecum. The polyps were 2 to 3 mm in       size. These were biopsied with a cold forceps for histology. Estimated       blood loss was minimal.      A single small-mouthed diverticulum was found in the cecum.  Non-bleeding internal hemorrhoids were found during retroflexion.  The       hemorrhoids were Grade II (internal hemorrhoids that prolapse but reduce       spontaneously). Impression:            - Two 2 to 3 mm polyps in the cecum. Biopsied.                        - Diverticulosis in the cecum.                        - Non-bleeding internal hemorrhoids. Recommendation:        - Await pathology results.                        - Written discharge instructions were provided to the                         patient.                        - Discharge patient to home.                        - Resume previous diet. Procedure Code(s):     --- Professional ---                        [phone], Colonoscopy, flexible; with biopsy, single or                         multiple Diagnosis Code(s):     --- Professional ---                        Z12.11, Encounter for screening for malignant neoplasm                         of colon                        D12.0, Benign neoplasm of cecum                        K64.1, Second degree hemorrhoids                        K57.30, Diverticulosis of large intestine without                         perforation or abscess without bleeding CPT copyright 2022 American Medical Association. All rights reserved. The codes documented in this report are preliminary and upon coder review may  be revised to meet current compliance requirements. Dr. Harrie Foreman, MD Sung Amabile MD, MD 02/26/2024 8:01:55 AM This report has been signed electronically. Number of Addenda: 0 Note Initiated On: 02/26/2024 7:13 AM Scope Withdrawal Time: 0 hours 9 minutes 58 seconds  Total Procedure Duration: 0 hours 17 minutes 45 seconds  Estimated Blood Loss:  Estimated blood loss was minimal.      [facility]

## 2024-02-26 NOTE — Transfer of Care (Signed)
 Immediate Anesthesia Transfer of Care Note  Patient: Bryan Gomez  Procedure(s) Performed: COLONOSCOPY WITH PROPOFOL POLYPECTOMY  Patient Location: Endoscopy Unit  Anesthesia Type:General  Level of Consciousness: drowsy  Airway & Oxygen Therapy: Patient Spontanous Breathing  Post-op Assessment: Report given to RN and Post -op Vital signs reviewed and stable  Post vital signs: Reviewed  Last Vitals:  Vitals Value Taken Time  BP 87/59 02/26/24 0758  Temp 36.3 C 02/26/24 0756  Pulse 62 02/26/24 0759  Resp 15 02/26/24 0759  SpO2 96 % 02/26/24 0759  Vitals shown include unfiled device data.  Last Pain:  Vitals:   02/26/24 0756  TempSrc: Temporal  PainSc: Asleep         Complications: No notable events documented.

## 2024-02-26 NOTE — Anesthesia Postprocedure Evaluation (Signed)
 Anesthesia Post Note  Patient: Bryan Gomez  Procedure(s) Performed: COLONOSCOPY WITH PROPOFOL POLYPECTOMY  Patient location during evaluation: Endoscopy Anesthesia Type: General Level of consciousness: awake and alert Pain management: pain level controlled Vital Signs Assessment: post-procedure vital signs reviewed and stable Respiratory status: spontaneous breathing, nonlabored ventilation, respiratory function stable and patient connected to nasal cannula oxygen Cardiovascular status: blood pressure returned to baseline and stable Postop Assessment: no apparent nausea or vomiting Anesthetic complications: no   No notable events documented.   Last Vitals:  Vitals:   02/26/24 0816 02/26/24 0826  BP: 114/74 120/78  Pulse: (!) 54 60  Resp: 18   Temp:    SpO2: 97%     Last Pain:  Vitals:   02/26/24 0806  TempSrc:   PainSc: 0-No pain                 Cleda Mccreedy Arcelia Pals

## 2024-02-27 LAB — SURGICAL PATHOLOGY

## 2024-06-09 ENCOUNTER — Other Ambulatory Visit: Payer: Self-pay

## 2024-06-09 ENCOUNTER — Emergency Department: Admission: EM | Admit: 2024-06-09 | Discharge: 2024-06-09 | Disposition: A | Discharge status: Home / Self Care

## 2024-06-09 ENCOUNTER — Emergency Department

## 2024-06-09 ENCOUNTER — Encounter: Payer: Self-pay | Admitting: Emergency Medicine

## 2024-06-09 DIAGNOSIS — R0781 Pleurodynia: Secondary | ICD-10-CM | POA: Diagnosis present

## 2024-06-09 DIAGNOSIS — R0789 Other chest pain: Secondary | ICD-10-CM | POA: Diagnosis not present

## 2024-06-09 DIAGNOSIS — R091 Pleurisy: Secondary | ICD-10-CM | POA: Diagnosis not present

## 2024-06-09 LAB — BASIC METABOLIC PANEL WITH GFR
Anion gap: 7 (ref 5–15)
BUN: 14 mg/dL (ref 8–23)
CO2: 27 mmol/L (ref 22–32)
Calcium: 8.9 mg/dL (ref 8.9–10.3)
Chloride: 108 mmol/L (ref 98–111)
Creatinine, Ser: 0.97 mg/dL (ref 0.61–1.24)
GFR, Estimated: >60 mL/min (ref >60)
Glucose, Bld: 102 mg/dL — ABNORMAL HIGH (ref 70–99)
Potassium: 4.5 mmol/L (ref 3.5–5.1)
Sodium: 142 mmol/L (ref 135–145)

## 2024-06-09 LAB — PROTIME-INR
INR: 1 (ref 0.8–1.2)
Prothrombin Time: 14 s (ref 11.4–15.2)

## 2024-06-09 LAB — HEPATIC FUNCTION PANEL
ALT: 19 U/L (ref 0–44)
AST: 20 U/L (ref 15–41)
Albumin: 3.7 g/dL (ref 3.5–5.0)
Alkaline Phosphatase: 62 U/L (ref 38–126)
Bilirubin, Direct: 0.1 mg/dL (ref 0.0–0.2)
Indirect Bilirubin: 0.9 mg/dL (ref 0.3–0.9)
Total Bilirubin: 1 mg/dL (ref 0.0–1.2)
Total Protein: 6.6 g/dL (ref 6.5–8.1)

## 2024-06-09 LAB — CBC
HCT: 42.2 % (ref 39.0–52.0)
Hemoglobin: 14.4 g/dL (ref 13.0–17.0)
MCH: 31 pg (ref 26.0–34.0)
MCHC: 34.1 g/dL (ref 30.0–36.0)
MCV: 90.8 fL (ref 80.0–100.0)
Platelets: 189 10*3/uL (ref 150–400)
RBC: 4.65 MIL/uL (ref 4.22–5.81)
RDW: 12.5 % (ref 11.5–15.5)
WBC: 6.8 10*3/uL (ref 4.0–10.5)
nRBC: 0 % (ref 0.0–0.2)

## 2024-06-09 LAB — TROPONIN I (HIGH SENSITIVITY)
Troponin I (High Sensitivity): 4 ng/L (ref <18)
Troponin I (High Sensitivity): 6 ng/L (ref <18)

## 2024-06-09 LAB — LIPASE, BLOOD: Lipase: 31 U/L (ref 11–51)

## 2024-06-09 MED ORDER — ACETAMINOPHEN 500 MG PO TABS: 1000.0000 mg | ORAL_TABLET | Freq: Four times a day (QID) | ORAL | 2 refills | Status: DC | PRN

## 2024-06-09 MED ORDER — IBUPROFEN 200 MG PO TABS: 600.0000 mg | ORAL_TABLET | Freq: Three times a day (TID) | ORAL | 0 refills | Status: DC | PRN

## 2024-06-09 MED ORDER — LIDOCAINE 5 % EX PTCH: 1.0000 | MEDICATED_PATCH | CUTANEOUS | 0 refills | Status: AC

## 2024-06-09 MED ORDER — IOHEXOL 350 MG/ML SOLN
75.0000 mL | Freq: Once | INTRAVENOUS | Status: AC | PRN
  Administered 2024-06-09: 75 mL via INTRAVENOUS

## 2024-06-09 MED ORDER — KETOROLAC TROMETHAMINE 15 MG/ML IJ SOLN
15.0000 mg | Freq: Once | INTRAMUSCULAR | Status: AC
  Administered 2024-06-09: 15 mg via INTRAVENOUS
  Filled 2024-06-09: qty 1

## 2024-06-09 NOTE — Discharge Instructions (Addendum)
 Your evaluation in the emergency department was overall reassuring.  I suspect you likely have a muscular strain or irritation of the lining of your lungs, I recommend use Tylenol and lidocaine  patches as needed for any ongoing discomfort.  You can also use Motrin as needed for any additional pain.  Please do follow-up with your primary care provider for reevaluation, and return to the emergency department with any new or worsening symptoms.

## 2024-06-09 NOTE — ED Provider Notes (Signed)
 [facility] Provider Note    Event Date/Time   First MD Initiated Contact with Patient 06/09/24 1827     (approximate)   History   Chest Pain  Patient to ED via POV for CP. Pt reports pain only occurs while moving behind his sternum into his left scapula. States this has been going on since mid-June after a trip to [place] . Sent over from UC.   Blue top sent to lab if needed.   HPI Bryan Gomez is a 68 y.o. male presents for evaluation of pleuritic chest pain -Has been ongoing for about 10 days.  Moderate--Notes a pressure in his chest as well as sharp pains in his back and sides.  Recently drove up to [place]  and then back down to [employer] [place] .  No obvious leg swelling in the interim. -Pain is somewhat positional.  It is pleuritic.  Has chronic cough.  No hemoptysis.  Does not feel short of breath currently. -Has not yet taken anything for pain     Physical Exam   Triage Vital Signs: ED Triage Vitals  Encounter Vitals Group     BP 06/09/24 1711 (!) 166/81     Girls Systolic BP Percentile --      Girls Diastolic BP Percentile --      Boys Systolic BP Percentile --      Boys Diastolic BP Percentile --      Pulse Rate 06/09/24 1711 60     Resp 06/09/24 1711 17     Temp 06/09/24 1711 97.9 F (36.6 C)     Temp Source 06/09/24 1711 Oral     SpO2 06/09/24 1711 100 %     Weight 06/09/24 1712 235 lb (106.6 kg)     Height 06/09/24 1712 6' 2 (1.88 m)     Head Circumference --      Peak Flow --      Pain Score 06/09/24 1712 7     Pain Loc --      Pain Education --      Exclude from Growth Chart --     Most recent vital signs: Vitals:   06/09/24 1711  BP: (!) 166/81  Pulse: 60  Resp: 17  Temp: 97.9 F (36.6 C)  SpO2: 100%     General: Awake, no distress.  CV:  Good peripheral perfusion. RRR, RP 2+ Resp:  Normal effort. CTAB Abd:  No distention. Nontender to deep palpation throughout Other:  Does have some left lateral chest wall  pain with raising of his left arm.  No chest wall tenderness to palpation.   ED Results / Procedures / Treatments   Labs (all labs ordered are listed, but only abnormal results are displayed) Labs Reviewed  BASIC METABOLIC PANEL WITH GFR - Abnormal; Notable for the following components:      Result Value   Glucose, Bld 102 (*)    All other components within normal limits  CBC  HEPATIC FUNCTION PANEL  PROTIME-INR  LIPASE, BLOOD  TROPONIN I (HIGH SENSITIVITY)  TROPONIN I (HIGH SENSITIVITY)     EKG  Ecg = sinus bradycardia, rate 59, no gross ST elevation or depression, no significant repolarization abnormality, normal axis, normal intervals.  No clear evidence of ischemia no arrhythmia on my interpretation.   RADIOLOGY Chest x-ray reviewed.  No acute pathology in my interpretation or in radiology report.  CTA chest reviewed, no acute pathology on my interpretation.  Radiology report reviewed.    PROCEDURES:  Critical Care performed: No  Procedures   MEDICATIONS ORDERED IN ED: Medications  ketorolac (TORADOL) 15 MG/ML injection 15 mg (15 mg Intravenous Given 06/09/24 1913)  iohexol  (OMNIPAQUE ) 350 MG/ML injection 75 mL (75 mLs Intravenous Contrast Given 06/09/24 1929)     IMPRESSION / MDM / ASSESSMENT AND PLAN / ED COURSE  I reviewed the triage vital signs and the nursing notes.                              DDX/MDM/AP: Differential diagnosis includes, but is not limited to, pulmonary embolism, pleurisy, doubt aortic dissection, pneumonia, pneumothorax.  Consider ACS.  Consider MSK strain.  Plan: - Labs - EKG - Chest x-ray  Patient's presentation is most consistent with acute presentation with potential threat to life or bodily function.  The patient is on the cardiac monitor to evaluate for evidence of arrhythmia and/or significant heart rate changes.  ED course below.  Initial laboratory workup unremarkable as well as chest x-ray.  Escalated to CTA chest,  unremarkable.  Presentation overall most consistent with MSK strain and/or pleurisy.  Rx Tylenol, Motrin, Lidoderm  patches.  Plan for PMD follow-up.  ED return precautions in place.  Patient agrees with plan.  Clinical Course as of 06/09/24 2108  Tue Jun 09, 2024  1859 CBC, BMP, troponin reviewed, unremarkable [MM]  2021 CTA chest: IMPRESSION: 1. No acute intrathoracic pathology. No CT evidence of pulmonary artery embolus. 2. Partially visualized 5 mm left renal upper pole calculus. 3.  Aortic Atherosclerosis (ICD10-I70.0).   [MM]  2021 Rpt trop stable [MM]  2026 LFTs wnl [MM]  2059 Patient reevaluated, feeling much better after Toradol.  Discussed unremarkable workup.  Overall suspect MSK strain/pleurisy.  Plan for Tylenol, lidocaine  patches and breakthrough Motrin.  For PMD follow-up.  ED return precautions in place.  Patient agrees with plan. [MM]    Clinical Course User Index [MM] Clarine Ozell LABOR, MD     FINAL CLINICAL IMPRESSION(S) / ED DIAGNOSES   Final diagnoses:  Atypical chest pain  Pleurisy     Rx / DC Orders   ED Discharge Orders          Ordered    lidocaine  (LIDODERM ) 5 %  Every 24 hours        06/09/24 2101    acetaminophen (TYLENOL) 500 MG tablet  Every 6 hours PRN        06/09/24 2101    ibuprofen (MOTRIN IB) 200 MG tablet  Every 8 hours PRN        06/09/24 2101             Note:  This document was prepared using Dragon voice recognition software and may include unintentional dictation errors.   Clarine Ozell LABOR, MD 06/09/24 2108

## 2024-06-09 NOTE — ED Triage Notes (Addendum)
 Patient to ED via POV for CP. Pt reports pain only occurs while moving behind his sternum into his left scapula. States this has been going on since mid-June after a trip to [place] . Sent over from [facility].   Blue top sent to lab if needed.

## 2024-06-26 ENCOUNTER — Ambulatory Visit: Payer: Self-pay

## 2024-06-26 NOTE — Telephone Encounter (Signed)
 FYI Only or Action Required?: Action required by provider: request for appointment.  Patient was last seen in primary care on .  Called Nurse Triage reporting Ankle Pain.  Symptoms began several weeks ago.  Interventions attempted: Nothing.  Symptoms are: unchanged. Right ankle pain, swelling x 3 weeks. No known injury.  Triage Disposition: See Physician Within 24 Hours  Patient/caregiver understands and will follow disposition?: Yes   Copied from CRM [id number]. Topic: Clinical - Red Word Triage >> Jun 26, 2024  1:04 PM Deleta RAMAN wrote: Red Word that prompted transfer to Nurse Triage: Patient/patient representative is calling to schedule an appointment. Establish care mentioned swelling and discomfort on ankle Reason for Disposition  [1] Very swollen joint AND [2] no fever  Answer Assessment - Initial Assessment Questions 1. ONSET: When did the pain start?      3 weeks ago 2. LOCATION: Where is the pain located?      Right  3. PAIN: How bad is the pain?  (Scale 1-10; or mild, moderate, severe)     5-6 4. WORK OR EXERCISE: Has there been any recent work or exercise that involved this part of the body?      no 5. CAUSE: What do you think is causing the ankle pain?     Unsure 6. OTHER SYMPTOMS: Do you have any other symptoms? (e.g., calf pain, rash, fever, swelling)     swelling 7. PREGNANCY: Is there any chance you are pregnant? When was your last menstrual period?     N/a  Protocols used: Ankle Pain-A-AH

## 2024-07-08 ENCOUNTER — Ambulatory Visit

## 2024-07-08 ENCOUNTER — Other Ambulatory Visit

## 2024-07-08 ENCOUNTER — Other Ambulatory Visit: Payer: Self-pay

## 2024-07-08 VITALS — BP 148/88 | HR 62 | Ht 74.0 in | Wt 239.6 lb

## 2024-07-08 DIAGNOSIS — I700 Atherosclerosis of aorta: Secondary | ICD-10-CM

## 2024-07-08 DIAGNOSIS — I2510 Atherosclerotic heart disease of native coronary artery without angina pectoris: Secondary | ICD-10-CM | POA: Diagnosis not present

## 2024-07-08 DIAGNOSIS — M7661 Achilles tendinitis, right leg: Secondary | ICD-10-CM

## 2024-07-08 DIAGNOSIS — C099 Malignant neoplasm of tonsil, unspecified: Secondary | ICD-10-CM | POA: Diagnosis not present

## 2024-07-08 DIAGNOSIS — I10 Essential (primary) hypertension: Secondary | ICD-10-CM | POA: Insufficient documentation

## 2024-07-08 DIAGNOSIS — S99911A Unspecified injury of right ankle, initial encounter: Secondary | ICD-10-CM

## 2024-07-08 DIAGNOSIS — J309 Allergic rhinitis, unspecified: Secondary | ICD-10-CM | POA: Insufficient documentation

## 2024-07-08 DIAGNOSIS — R413 Other amnesia: Secondary | ICD-10-CM | POA: Insufficient documentation

## 2024-07-08 DIAGNOSIS — S99919A Unspecified injury of unspecified ankle, initial encounter: Secondary | ICD-10-CM | POA: Insufficient documentation

## 2024-07-08 NOTE — Progress Notes (Signed)
 New Patient Visit   Physician: Alylah Blakney A Deette Revak, MD  Patient: Bryan Gomez   DOB: 09-23-1956   68 y.o. Male  MRN: [id number] Visit Date: 07/08/2024   Chief Complaint  Patient presents with   Establish Care    Ankle pain, right - had an injury 2 weeks ago    Subjective  Bryan Gomez is a 68 y.o. male who presents today as a new patient to establish care.   HPI  Discussed the use of AI scribe software for clinical note transcription with the patient, who gave verbal consent to proceed.  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  Patient denies chest pain and palpitations.   Nephrolithiasis - Partially visualized 5 mm left renal calculus identified on most recent imaging  Chronic cough and upper airway symptoms - Chronic cough and postnasal drip  - Trialed Mucinex and Flonase without significant relief - Occasional wheezing, especially in the evenings, relieved by albuterol   - CTA showing atelectasis, but no other pulmonary abnormality  Hypertension - Elevated blood pressure, typically around 140/70s at home - Blood pressure measured at 148/88 during the visit - Does not regularly monitor blood pressure at home  Musculoskeletal injury - right ankle - Twisted right ankle two weeks ago while walking at night, heard a pop and experienced significant pain - Required limping to return home - Managed with ankle wrap, elevation, and ice, resulting in improvement now.   Denies current pain  History of Erectile dysfunction - Erectile dysfunction for  approximately seven years - Trialed PDE5 inhibitors including Cialis and Viagra - Evaluated by urology in 2012  History of pleuritic chest pain for which he had been seen in Urgent Care - History of pleurisy with pain under the sternum and right scapula lasting two to three weeks - Pain resolved with ibuprofen  and acetaminophen   Chronic pain and allergic symptoms - Uses meloxicam every other day for pain management - Takes antihistamines prn for seasonal allergies   Patient has issues with word finding and focus since treatment with radiation.  He is not sure if this is a progressive issue.  He does feel that his general memory has declined.    FH significant for various cancers and DM as listed below.   He does exercise with yard work and walking.     Assessment & Plan   Problem List Items Addressed This Visit       Cardiovascular and Mediastinum   Aortic atherosclerosis (HCC) - Primary   Relevant Orders   PSA   Lipoprotein A (LPA)   CBC with Differential/Platelet   Comprehensive metabolic panel with GFR   Hemoglobin A1c   Lipid panel   Urinalysis, Routine w reflex microscopic   Coronary artery disease   Relevant Orders   PSA   Lipoprotein A (LPA)   CBC with Differential/Platelet   Comprehensive metabolic panel with GFR   Hemoglobin A1c   Lipid panel   Urinalysis, Routine w reflex microscopic   CT CARDIAC SCORING (SELF PAY ONLY)   Hypertension     Respiratory   Right tonsillar squamous cell carcinoma (HCC)   Allergic rhinitis  Musculoskeletal and Integument   Achilles tendinitis     Other   Ankle injury   Memory loss   Assessment and Plan  Orders Placed This Encounter  Procedures   CT CARDIAC SCORING (SELF PAY ONLY)   PSA   Lipoprotein A (LPA)   CBC with Differential/Platelet   Comprehensive metabolic panel with GFR   Hemoglobin A1c   Lipid panel   Urinalysis, Routine w reflex microscopic       Coronary artery disease with aortic  atherosclerosis  Family history of heart disease and historically elevated cholesterol. - Order calcium scoring to assess coronary plaque burden. - Check lipid panel for current cholesterol levels.   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 No recurrence. Regular ENT surveillance every six months.  Intermittent wheezing Intermittent evening wheezing relieved by albuterol . CTA showed atelectasis without significant findings. - Continue albuterol  inhaler as needed for wheezing. Possible PND component. No associated SOB  Allergic rhinitis with nasal polyp Chronic postnasal drip and nasal polyp. Flonase ineffective. Symptoms not currently bothersome. - Consider trial of mometasone nasal spray if symptoms worsen.  Erectile dysfunction Erectile dysfunction for seven years. Previously tried PDE5 inhibitors. Evaluated by urology in 2012.  Memory complaints, mild Mild memory complaints possibly related to past cancer treatment. Memory decline is noticeable but unclear if progressive. Scored 29/30 on mini mental exam with past physician - Monitor memory function and report significant changes.      Plan for follow-up in 4 weeks with labs.     Objective  BP (!) 148/88 (BP Location: Left Arm, Patient Position: Sitting, Cuff Size: Normal)   Pulse 62   Ht 6' 2 (1.88 m)   Wt 239 lb 9.6 oz (108.7 kg)   SpO2 94%   BMI 30.76 kg/m      Review of Systems  Constitutional:  Negative for chills, fever and weight loss.  Eyes:  Negative for blurred vision.  Respiratory:  Negative for cough and shortness of breath.   Cardiovascular:  Negative for chest pain and palpitations.  Skin:  Negative for rash.  Psychiatric/Behavioral:  Negative for depression. The patient is not nervous/anxious.      Physical Exam Physical Exam Vitals reviewed.  Constitutional:      Appearance: Normal  appearance. Well-developed with normal weight.  HENT:     Head: Normocephalic and atraumatic.  Normal mucous membranes, no oral lesions Eyes:     Pupils: Pupils are equal, round, and reactive to light.  Neck:     Thyroid: No thyroid mass or thyromegaly.  Cardiovascular:     Rate and Rhythm: Normal rate and regular rhythm. Normal heart sounds. Normal peripheral pulses Pulmonary:     Normal breath sounds with normal effort Abdominal:   Abdomen is soft, without tenderness or noted hepatosplenomegaly Musculoskeletal:        General: No swelling or edema  Lymphadenopathy:     Cervical: No cervical adenopathy.  Skin:    General: Skin is warm and dry without noticeable rash. Neurological:     General: No focal deficit present.  Psychiatric:        Mood and Affect: Mood, behavior and cognition normal   ANKLE:  Normal active and passive ROM without tenderness to palpation.       Past Medical History:  Diagnosis Date   Arthritis 2000   Cancer [facility])    post HPV   Clotting disorder (HCC) 2021  Depression 2015   Past Surgical History:  Procedure Laterality Date   ADENOIDECTOMY     COLONOSCOPY WITH PROPOFOL  N/A 02/26/2024   Procedure: COLONOSCOPY WITH PROPOFOL ;  Surgeon: Tye Millet, DO;  Location: [facility] ENDOSCOPY;  Service: General;  Laterality: N/A;   INGUINAL HERNIA REPAIR Left    INGUINAL LYMPH NODE BIOPSY Right    removal of lymph node   POLYPECTOMY  02/26/2024   Procedure: POLYPECTOMY;  Surgeon: Tye Millet, DO;  Location: [facility] ENDOSCOPY;  Service: General;;   TONSILLECTOMY     Family Status  Relation Name Status   Brother  (Not Specified)   Mother Cathlean Searles   Father Marinell Alive   PGF Ryan Alive   Pat Aunt Cy Alive  No partnership data on file   Family History  Problem Relation Age of Onset   Multiple myeloma Brother    Diabetes Mother    Obesity Mother    Vision loss Father    Heart disease Paternal Grandfather    Hyperlipidemia Paternal Grandfather     Stroke Paternal Grandfather    Heart disease Paternal Aunt    Social History   Socioeconomic History   Marital status: Married    Spouse name: Not on file   Number of children: Not on file   Years of education: Not on file   Highest education level: Professional school degree (e.g., MD, DDS, DVM, JD)  Occupational History   Not on file  Tobacco Use   Smoking status: Never   Smokeless tobacco: Never  Vaping Use   Vaping status: Never Used  Substance and Sexual Activity   Alcohol use: Yes    Alcohol/week: 4.0 standard drinks of alcohol    Types: 2 Cans of beer, 2 Standard drinks or equivalent per week    Comment: social drinker   Drug use: Never   Sexual activity: Not Currently  Other Topics Concern   Not on file  Social History Narrative   Not on file   Social Drivers of Health   Financial Resource Strain: Low Risk  (12/31/2023)   Received from [facility]   Overall Financial Resource Strain (CARDIA)    Difficulty of Paying Living Expenses: Not hard at all  Food Insecurity: No Food Insecurity (07/06/2024)   Hunger Vital Sign    Worried About Running Out of Food in the Last Year: Never true    Ran Out of Food in the Last Year: Never true  Transportation Needs: No Transportation Needs (07/06/2024)   PRAPARE - Administrator, Civil Service (Medical): No    Lack of Transportation (Non-Medical): No  Physical Activity: Sufficiently Active (07/06/2024)   Exercise Vital Sign    Days of Exercise per Week: 5 days    Minutes of Exercise per Session: 40 min  Stress: No Stress Concern Present (07/06/2024)   [employer] of Occupational Health - Occupational Stress Questionnaire    Feeling of Stress: Only a little  Social Connections: Moderately Integrated (07/06/2024)   Social Connection and Isolation Panel    Frequency of Communication with Friends and Family: Once a week    Frequency of Social Gatherings with Friends and Family: Once a week     Attends Religious Services: More than 4 times per year    Active Member of [employer] or Organizations: Yes    Attends Banker Meetings: More than 4 times per year    Marital Status: Married   Outpatient Medications Prior to Visit  Medication  Sig   albuterol  (VENTOLIN  HFA) 108 (90 Base) MCG/ACT inhaler Inhale 2 puffs into the lungs every 6 (six) hours as needed for wheezing or shortness of breath.   cetirizine (ZYRTEC) 10 MG chewable tablet Chew 10 mg by mouth daily.   chlorpheniramine (CHLOR-TRIMETON) 2 MG/5ML syrup Take 2 mg by mouth every 4 (four) hours as needed for allergies. (Patient taking differently: Take 4 mg by mouth every 4 (four) hours as needed for allergies.)   meloxicam (MOBIC) 15 MG tablet Take 15 mg by mouth daily. (Patient taking differently: Take 15 mg by mouth daily. Every other day)   [DISCONTINUED] acetaminophen  (TYLENOL ) 500 MG tablet Take 2 tablets (1,000 mg total) by mouth every 6 (six) hours as needed.   [DISCONTINUED] AMBULATORY NON FORMULARY MEDICATION Trimix (30/1/10)-(Pap/Phent/PGE)  Test Dose  1ml vial   Qty #3 Refills 0  [facility] [phone] Fax [phone]   [DISCONTINUED] ibuprofen  (MOTRIN  IB) 200 MG tablet Take 3 tablets (600 mg total) by mouth every 8 (eight) hours as needed (for pain no controlled by tylenol  and lidoderm  patches).   No facility-administered medications prior to visit.   No Known Allergies  Immunization History  Administered Date(s) Administered   Unspecified SARS-COV-2 Vaccination 01/11/2020, 02/08/2020, 08/10/2020    Health Maintenance  Topic Date Due   Medicare Annual Wellness (AWV)  Never done   Hepatitis C Screening  Never done   DTaP/Tdap/Td (1 - Tdap) Never done   Pneumococcal Vaccine: 50+ Years (1 of 2 - PCV) Never done   Zoster Vaccines- Shingrix (1 of 2) Never done   COVID-19 Vaccine (4 - 2024-25 season) 07/24/2024 (Originally 08/11/2023)   INFLUENZA VACCINE  07/10/2024   Colonoscopy   02/25/2034   Hepatitis B Vaccines  Aged Out   HPV VACCINES  Aged Out   Meningococcal B Vaccine  Aged Out    Patient Care Team: Patient, No Pcp Per as PCP - General (General Practice) Lenn Aran, MD as Referring Physician (Radiation Oncology) Jacobo Evalene PARAS, MD as Consulting Physician (Oncology)  Depression Screen     No data to display           Parris DELENA Juneau, MD  [facility] [phone] (phone) [phone] (fax)  [facility]

## 2024-07-09 ENCOUNTER — Other Ambulatory Visit

## 2024-07-10 ENCOUNTER — Ambulatory Visit: Payer: Self-pay

## 2024-07-10 DIAGNOSIS — E7841 Elevated Lipoprotein(a): Secondary | ICD-10-CM

## 2024-07-14 DIAGNOSIS — E7841 Elevated Lipoprotein(a): Secondary | ICD-10-CM | POA: Insufficient documentation

## 2024-07-14 LAB — URINALYSIS, ROUTINE W REFLEX MICROSCOPIC
Bilirubin Urine: NEGATIVE
Glucose, UA: NEGATIVE
Hgb urine dipstick: NEGATIVE
Ketones, ur: NEGATIVE
Leukocytes,Ua: NEGATIVE
Nitrite: NEGATIVE
Protein, ur: NEGATIVE
Specific Gravity, Urine: 1.021 (ref 1.001–1.035)
pH: 6 (ref 5.0–8.0)

## 2024-07-14 LAB — LIPID PANEL
Cholesterol: 179 mg/dL (ref <200)
HDL: 36 mg/dL — ABNORMAL LOW (ref >=40)
LDL Cholesterol (Calc): 118 mg/dL — ABNORMAL HIGH
Non-HDL Cholesterol (Calc): 143 mg/dL — ABNORMAL HIGH (ref <130)
Total CHOL/HDL Ratio: 5 (calc) — ABNORMAL HIGH (ref <5.0)
Triglycerides: 131 mg/dL (ref <150)

## 2024-07-14 LAB — COMPREHENSIVE METABOLIC PANEL WITH GFR
AG Ratio: 2.4 (calc) (ref 1.0–2.5)
ALT: 14 U/L (ref 9–46)
AST: 17 U/L (ref 10–35)
Albumin: 4.1 g/dL (ref 3.6–5.1)
Alkaline phosphatase (APISO): 60 U/L (ref 35–144)
BUN: 14 mg/dL (ref 7–25)
CO2: 28 mmol/L (ref 20–32)
Calcium: 8.8 mg/dL (ref 8.6–10.3)
Chloride: 107 mmol/L (ref 98–110)
Creat: 0.94 mg/dL (ref 0.70–1.28)
Globulin: 1.7 g/dL — ABNORMAL LOW (ref 1.9–3.7)
Glucose, Bld: 87 mg/dL (ref 65–99)
Potassium: 4.4 mmol/L (ref 3.5–5.3)
Sodium: 142 mmol/L (ref 135–146)
Total Bilirubin: 0.8 mg/dL (ref 0.2–1.2)
Total Protein: 5.8 g/dL — ABNORMAL LOW (ref 6.1–8.1)
eGFR: 87 mL/min/1.73m2 (ref >=60)

## 2024-07-14 LAB — CBC WITH DIFFERENTIAL/PLATELET
Absolute Lymphocytes: 959 {cells}/uL (ref 850–3900)
Absolute Monocytes: 418 {cells}/uL (ref 200–950)
Basophils Absolute: 31 {cells}/uL (ref 0–200)
Basophils Relative: 0.7 %
Eosinophils Absolute: 220 {cells}/uL (ref 15–500)
Eosinophils Relative: 5 %
HCT: 43.9 % (ref 38.5–50.0)
Hemoglobin: 14.3 g/dL (ref 13.2–17.1)
MCH: 30.9 pg (ref 27.0–33.0)
MCHC: 32.6 g/dL (ref 32.0–36.0)
MCV: 94.8 fL (ref 80.0–100.0)
MPV: 10.1 fL (ref 7.5–12.5)
Monocytes Relative: 9.5 %
Neutro Abs: 2772 {cells}/uL (ref 1500–7800)
Neutrophils Relative %: 63 %
Platelets: 133 Thousand/uL — ABNORMAL LOW (ref 140–400)
RBC: 4.63 Million/uL (ref 4.20–5.80)
RDW: 13.6 % (ref 11.0–15.0)
Total Lymphocyte: 21.8 %
WBC: 4.4 Thousand/uL (ref 3.8–10.8)

## 2024-07-14 LAB — HEMOGLOBIN A1C
Hgb A1c MFr Bld: 5.1 % (ref <5.7)
Mean Plasma Glucose: 100 mg/dL
eAG (mmol/L): 5.5 mmol/L

## 2024-07-14 LAB — PSA: PSA: 3.25 ng/mL (ref <=4.00)

## 2024-07-14 LAB — LIPOPROTEIN A (LPA): Lipoprotein (a): 191 nmol/L — ABNORMAL HIGH (ref <75)

## 2024-07-21 ENCOUNTER — Ambulatory Visit
Admission: RE | Admit: 2024-07-21 | Discharge: 2024-07-21 | Disposition: A | Discharge status: Home / Self Care | Payer: Self-pay | Source: Ambulatory Visit

## 2024-07-21 DIAGNOSIS — I2510 Atherosclerotic heart disease of native coronary artery without angina pectoris: Secondary | ICD-10-CM | POA: Insufficient documentation

## 2024-07-30 ENCOUNTER — Ambulatory Visit

## 2024-07-30 VITALS — BP 158/82 | HR 58 | Ht 74.0 in | Wt 242.1 lb

## 2024-07-30 DIAGNOSIS — E7841 Elevated Lipoprotein(a): Secondary | ICD-10-CM | POA: Diagnosis not present

## 2024-07-30 DIAGNOSIS — R053 Chronic cough: Secondary | ICD-10-CM | POA: Diagnosis not present

## 2024-07-30 DIAGNOSIS — I2510 Atherosclerotic heart disease of native coronary artery without angina pectoris: Secondary | ICD-10-CM | POA: Diagnosis not present

## 2024-07-30 DIAGNOSIS — I159 Secondary hypertension, unspecified: Secondary | ICD-10-CM

## 2024-07-30 MED ORDER — ROSUVASTATIN CALCIUM 20 MG PO TABS: 20.0000 mg | ORAL_TABLET | Freq: Every day | ORAL | 3 refills | Status: AC

## 2024-07-30 MED ORDER — ASPIRIN 81 MG PO TBEC: 81.0000 mg | DELAYED_RELEASE_TABLET | Freq: Every day | ORAL | Status: AC

## 2024-07-30 NOTE — Progress Notes (Signed)
 Progress Note  Physician: Bravlio Luca A Kongmeng Santoro, MD   HPI: Bryan Gomez is a 68 y.o. Gomez presenting on 07/30/2024 for Follow-up .  Discussed the use of AI scribe software for clinical note transcription with the patient, who gave verbal consent to proceed.  History of Present Illness   Bryan Gomez is a 68 year old Gomez who presents for evaluation of cardiovascular risk and persistent cough.  Cardiovascular risk factors - Elevated LDL and non-HDL cholesterol levels - High lipoprotein A level - Coronary artery calcium  score of 149 with calcification in the left anterior descending artery - Blood pressure readings of 148/80 mmHg and 158/86 mmHg - Not currently taking antihypertensive medication  Chronic cough - Persistent cough - Cough improves with use of albuterol  inhaler - Pulmonary function test 45 years ago showed above-normal results - Possible history of asthma - no evidence for improvement with nasal sprays  Mild thrombocytopeniua - Platelet count is 133  Oncologic history - 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 Interim medical history since our last visit reviewed.  Allergies and medications reviewed and updated.   ROS: Negative unless specifically indicated above in HPI.    Current Outpatient Medications:    albuterol  (VENTOLIN  HFA) 108 (90 Base) MCG/ACT inhaler, Inhale 2 puffs into the lungs every 6 (six) hours as needed for wheezing or shortness of breath., Disp: 8 g, Rfl: 2   aspirin  EC 81 MG tablet, Take 1 tablet (81 mg total) by mouth daily. Swallow whole., Disp: , Rfl:    cetirizine (ZYRTEC) 10 MG chewable tablet, Chew 10 mg by mouth daily., Disp: , Rfl:    chlorpheniramine (CHLOR-TRIMETON) 2 MG/5ML syrup, Take 2 mg by mouth every 4 (four) hours as needed for allergies., Disp:  , Rfl:    meloxicam (MOBIC) 15 MG tablet, Take 15 mg by mouth daily., Disp: , Rfl:    rosuvastatin  (CRESTOR ) 20 MG tablet, Take 1 tablet (20 mg total) by mouth daily., Disp: 90 tablet, Rfl: 3       Objective:     BP (!) 158/82 (BP Location: Left Arm, Patient Position: Sitting, Cuff Size: Large)   Pulse (!) 58   Ht 6' 2 (1.88 m)   Wt 242 lb 2 oz (109.8 kg)   SpO2 98%   BMI 31.09 kg/m   Wt Readings from Last 3 Encounters:  07/30/24 242 lb 2 oz (109.8 kg)  07/08/24 239 lb 9.6 oz (108.7 kg)  06/09/24 235 lb (106.6 kg)    Physical Exam  Physical Exam Vitals reviewed.  Constitutional:      Appearance: Normal appearance. Well-developed with normal weight.  Cardiovascular:     Rate and Rhythm: Normal rate and regular rhythm. Normal heart sounds. Normal peripheral pulses Pulmonary:     Normal breath sounds with normal effort Skin:    General: Skin is warm and dry without noticeable rash. Neurological:     General: No focal deficit present.  Psychiatric:        Mood and Affect: Mood, behavior and cognition normal      Assessment & Plan:   Encounter Diagnoses  Name Primary?   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 Cancer and chemotherapy history further increase risk. Goal is aggressive LDL cholesterol management to reduce cardiovascular risk. Statins are recommended to lower LDL with a target below 50 mg/dL. Baby aspirin  is advised for cardiovascular risk  reduction. Cardiologist consultation is recommended for further evaluation - Start rosuvastatin  to lower LDL cholesterol with a target below 55 mg/dL - Advise taking baby aspirin  for cardiovascular risk reduction - Refer to cardiology for further evaluation of the left anterior descending artery - query non-calcified plaque as additional burden - Instruct to report any new symptoms such as shortness of breath, chest pain, jaw pain, or arm pain - Advise to delay starting rosuvastatin  until after his vacation to avoid potential side effects during travel  Hypertension Blood pressure readings elevated, with recent measurements of 158/86 mmHg. No current antihypertensive medication regimen. Hypertension contributes to coronary artery disease. A blood pressure log is needed to assess home readings before initiating medication. - Instruct to keep a blood pressure log with at least ten values, ensuring measurements are taken after sitting for 10-15 minutes - Plan a phone follow-up in a couple of weeks to review blood pressure log and decide on potential medication initiation  Thrombocytopenia Platelet count is 133,000/L, slightly low but not concerning. Previous thrombocytopenia during chemotherapy with counts as low as 50,000/L. Current level does not warrant immediate intervention but will be monitored. - Recheck platelet count in four months to monitor for any downward trend  Chronic cough with wheezing, possible adult-onset asthma Chronic cough with wheezing, possibly indicative of adult-onset asthma. Albuterol  inhaler provides significant relief, suggesting a possible asthmatic component. Previous pulmonary function test was performed 45 years ago, and a repeat test is warranted to assess current lung function and response to bronchodilators. - Order pulmonary function test to evaluate for asthma - Advise to continue using albuterol  inhaler as needed for symptom relief  Follow-Up Follow-up plans  discussed to monitor and manage current health issues. - Schedule follow-up appointment after vacation to review pulmonary function test results and blood pressure log - Plan phone follow-up in a couple of weeks to discuss blood pressure log results

## 2024-08-21 ENCOUNTER — Other Ambulatory Visit: Payer: Self-pay | Admitting: Medical Genetics

## 2024-08-24 ENCOUNTER — Ambulatory Visit (INDEPENDENT_AMBULATORY_CARE_PROVIDER_SITE_OTHER)

## 2024-08-24 VITALS — BP 130/78 | HR 60 | Temp 98.0°F | Wt 240.0 lb

## 2024-08-24 DIAGNOSIS — I159 Secondary hypertension, unspecified: Secondary | ICD-10-CM

## 2024-08-24 DIAGNOSIS — R053 Chronic cough: Secondary | ICD-10-CM

## 2024-08-24 DIAGNOSIS — I2510 Atherosclerotic heart disease of native coronary artery without angina pectoris: Secondary | ICD-10-CM | POA: Diagnosis not present

## 2024-08-24 NOTE — Progress Notes (Signed)
 Progress Note  Physician: Gokul Waybright A Armonte Tortorella, MD   HPI: Bryan Gomez is a 68 y.o. male presenting on 08/24/2024 for Hypertension (Declines flu shot today, will get later/BP monitor more than 70 years old) .  Discussed the use of AI scribe software for clinical note transcription with the patient, who gave verbal consent to proceed.  History of Present Illness   Bryan Gomez is a 68 year old male with hypertension and coronary artery disease who presents for blood pressure management.  Elevated blood pressure - Systolic blood pressure at home typically in the 145s to 160s mmHg range - today in office 130/78 - Occasional elevated diastolic blood pressure readings - Not currently taking antihypertensive medications - Home blood pressure monitor is old, which may affect accuracy  Coronary artery disease - High cardiovascular risk due to elevated lipoprotein(a) and a coronary calcium  score of 149, indicating calcification in the left anterior descending artery  - No current chest pain or shortness of breath with physical activity - Not taking medications for coronary artery disease - Cardiology appointment scheduled for September 08, 2024  Pulmonary evaluation - Chronic cough with wheezing, possibly indicative of adult-onset asthma. Albuterol  inhaler provides significant relief, suggesting a possible asthmatic component.  - Awaiting pulmonary function test         Medical history:  Relevant past medical, surgical, family and social history reviewed and updated as indicated. Interim medical history since our last visit reviewed.  Allergies and medications reviewed and updated.   ROS: Negative unless specifically indicated above in HPI.    Current Outpatient Medications:    albuterol  (VENTOLIN  HFA) 108 (90 Base) MCG/ACT inhaler, Inhale 2 puffs into the lungs every 6 (six) hours as needed for wheezing or shortness of breath., Disp: 8 g, Rfl: 2   aspirin  EC 81  MG tablet, Take 1 tablet (81 mg total) by mouth daily. Swallow whole., Disp: , Rfl:    cetirizine (ZYRTEC) 10 MG chewable tablet, Chew 10 mg by mouth daily., Disp: , Rfl:    chlorpheniramine (CHLOR-TRIMETON) 2 MG/5ML syrup, Take 2 mg by mouth every 4 (four) hours as needed for allergies., Disp: , Rfl:    meloxicam (MOBIC) 15 MG tablet, Take 15 mg by mouth daily., Disp: , Rfl:    rosuvastatin  (CRESTOR ) 20 MG tablet, Take 1 tablet (20 mg total) by mouth daily., Disp: 90 tablet, Rfl: 3       Objective:     BP 130/78 (BP Location: Right Arm, Patient Position: Sitting, Cuff Size: Normal)   Pulse 60   Temp 98 F (36.7 C) (Oral)   Wt 240 lb (108.9 kg)   BMI 30.81 kg/m   Wt Readings from Last 3 Encounters:  08/24/24 240 lb (108.9 kg)  07/30/24 242 lb 2 oz (109.8 kg)  07/08/24 239 lb 9.6 oz (108.7 kg)    Physical Exam  Physical Exam Vitals reviewed.  Constitutional:      Appearance: Normal appearance. Well-developed with normal weight.  Cardiovascular:     Rate and Rhythm: Normal rate and regular rhythm. Normal heart sounds. Normal peripheral pulses Pulmonary:     Normal breath sounds with normal effort Skin:    General: Skin is warm and dry without noticeable rash. Neurological:     General: No focal deficit present.  Psychiatric:        Mood and Affect: Mood, behavior and cognition normal      Assessment &  Plan:   Encounter Diagnoses  Name Primary?   Secondary hypertension Yes   Coronary artery disease involving native heart without angina pectoris, unspecified vessel or lesion type    Chronic cough     No orders of the defined types were placed in this encounter.    Assessment and Plan    Hypertension in the setting of coronary artery disease Blood pressure readings are elevated, with systolic values in the 150s-160s and occasional high diastolic readings. Elevated blood pressure can facilitate plaque deposition, worsening coronary artery disease.  - Obtain a new  blood pressure monitor. - Instruct to check blood pressure at different times after sitting for 10-15 minutes. - Schedule a follow-up phone call in 4 weeks to review blood pressure readings and discuss cardiology recommendations.  Bring BP log to cardiology appt  Coronary artery disease Coronary artery disease is present, with a focus on preventing progression through blood pressure management. Awaiting cardiology consultation scheduled for September 08, 2024.   Chronic cough/wheezing - Await results of pulmonary function test. -   General Health Maintenance Discussed timing of flu vaccination. - He will obtain next month    Fu in 5 weeks

## 2024-08-27 ENCOUNTER — Other Ambulatory Visit

## 2024-09-08 ENCOUNTER — Ambulatory Visit: Admitting: Cardiology

## 2024-09-24 ENCOUNTER — Ambulatory Visit

## 2024-09-24 VITALS — BP 142/70 | HR 55 | Ht 74.0 in | Wt 245.6 lb

## 2024-09-24 DIAGNOSIS — I2510 Atherosclerotic heart disease of native coronary artery without angina pectoris: Secondary | ICD-10-CM

## 2024-09-24 DIAGNOSIS — I10 Essential (primary) hypertension: Secondary | ICD-10-CM | POA: Diagnosis not present

## 2024-09-24 MED ORDER — LOSARTAN POTASSIUM 50 MG PO TABS: 50.0000 mg | ORAL_TABLET | Freq: Every day | ORAL | 1 refills | Status: AC

## 2024-09-24 NOTE — Progress Notes (Signed)
 Progress Note  Physician: Daylan Boggess A Alfreida Steffenhagen, MD   HPI: Bryan Gomez is a 68 y.o. male presenting on 09/24/2024 for Follow-up .  Discussed the use of AI scribe software for clinical note transcription with the patient, who gave verbal consent to proceed.  History of Present Illness    Hypertension  - Elevated blood pressure readings at home even with new BP cuff - Has not yet started antihypertensive medication.  BP log showing systolic pressures betweek 130-170  Coronary artery disease and cardiovascular risk - On statin and baby aspirin  for secondary prevention - High lipoprotein A level of 179, indicating increased cardiovascular risk - Coronary artery calcium  score of 151 (50th percentile), with left anterior descending artery score of 149 - No chest pain, shortness of breath with exertion, jaw pain, or arm pain  Cardiology follow-up - Previously canceled cardiology appointment due to mother-in-law's passing and burial - Next cardiology appointment scheduled for October 22      Medical history:  Relevant past medical, surgical, family and social history reviewed and updated as indicated. Interim medical history since our last visit reviewed.  Allergies and medications reviewed and updated.   ROS: Negative unless specifically indicated above in HPI.    Current Outpatient Medications:    albuterol  (VENTOLIN  HFA) 108 (90 Base) MCG/ACT inhaler, Inhale 2 puffs into the lungs every 6 (six) hours as needed for wheezing or shortness of breath., Disp: 8 g, Rfl: 2   aspirin  EC 81 MG tablet, Take 1 tablet (81 mg total) by mouth daily. Swallow whole., Disp: , Rfl:    cetirizine (ZYRTEC) 10 MG chewable tablet, Chew 10 mg by mouth daily., Disp: , Rfl:    losartan (COZAAR) 50 MG tablet, Take 1 tablet (50 mg total) by mouth daily., Disp: 90 tablet, Rfl: 1   meloxicam (MOBIC) 15 MG tablet, Take 15 mg by mouth daily., Disp: , Rfl:    rosuvastatin  (CRESTOR ) 20 MG tablet,  Take 1 tablet (20 mg total) by mouth daily., Disp: 90 tablet, Rfl: 3   chlorpheniramine (CHLOR-TRIMETON) 2 MG/5ML syrup, Take 2 mg by mouth every 4 (four) hours as needed for allergies., Disp: , Rfl:        Objective:     BP (!) 142/70   Pulse (!) 55   Ht 6' 2 (1.88 m)   Wt 245 lb 9.6 oz (111.4 kg)   SpO2 95%   BMI 31.53 kg/m   Wt Readings from Last 3 Encounters:  09/24/24 245 lb 9.6 oz (111.4 kg)  08/24/24 240 lb (108.9 kg)  07/30/24 242 lb 2 oz (109.8 kg)    Physical Exam  Physical Exam Vitals reviewed.  Constitutional:      Appearance: Normal appearance. Well-developed with normal weight.  Cardiovascular:     Rate and Rhythm: Normal rate and regular rhythm. Normal heart sounds. Normal peripheral pulses Pulmonary:     Normal breath sounds with normal effort Skin:    General: Skin is warm and dry without noticeable rash. Neurological:     General: No focal deficit present.  Psychiatric:        Mood and Affect: Mood, behavior and cognition normal      Assessment & Plan:   Encounter Diagnoses  Name Primary?   Coronary artery disease involving native heart without angina pectoris, unspecified vessel or lesion type Yes   Hypertension, unspecified type     No orders of the defined types were  placed in this encounter.    Assessment and Plan    Coronary artery disease Moderate plaque in the left anterior descending artery with a calcium  score of 149, overall score 151 (50th percentile). Other vessels show minimal or no plaque.  Reviewed and discussed Ca score - Continue statin therapy. - Continue low dose ASA, LDL goal <70 - Monitor for cardiac symptoms  - Keep cardiology appt  Hypertension Blood pressure remains elevated despite lifestyle modifications. Losartan chosen to achieve target blood pressure <130/80 mmHg due to its tolerability and effectiveness. - Start losartan. - Monitor blood pressure and record values. - Provide blood pressure readings to the  office in several weeks.  Hyperlipidemia On statin therapy due to elevated lipoprotein A and moderate coronary artery disease. Goal is to maintain LDL levels around 70 mg/dL.

## 2024-09-30 ENCOUNTER — Encounter: Payer: Self-pay | Admitting: Cardiology

## 2024-09-30 ENCOUNTER — Ambulatory Visit: Attending: Cardiology | Admitting: Cardiology

## 2024-09-30 VITALS — BP 130/68 | HR 78 | Ht 74.0 in | Wt 246.0 lb

## 2024-09-30 DIAGNOSIS — E7841 Elevated Lipoprotein(a): Secondary | ICD-10-CM

## 2024-09-30 DIAGNOSIS — I2510 Atherosclerotic heart disease of native coronary artery without angina pectoris: Secondary | ICD-10-CM

## 2024-09-30 DIAGNOSIS — E7800 Pure hypercholesterolemia, unspecified: Secondary | ICD-10-CM | POA: Diagnosis not present

## 2024-09-30 NOTE — Progress Notes (Signed)
 Cardiology Office Note:    Date:  09/30/2024   ID:  Brendan, Gadson 17-Feb-1956, MRN [id number]  PCP:  Everlene Parris LABOR, MD   [facility] Providers Cardiologist:  None     Referring MD: Everlene Parris LABOR, MD   Chief Complaint  Patient presents with   Establish Care     New pt has been doing well with no complaints of chest pain, chest pressure or SOB, medciation reviewed verbally with patient    History of Present Illness:    GORGE ALMANZA is a 68 y.o. male with a hx of CAD (LAD, RCA calcifications on chest CT, calcium  score 151, 50th percentile ), hyperlipidemia presenting with abnormal lab work.  Had a lipoprotein a blood work 3 months ago noted to be abnormal, LDL was elevated.  Coronary calcium  score 8/25 showed a value of 151.  Started on Crestor  20 mg daily, aspirin  81 mg daily which she started taking about 2 weeks ago.  Patient denies chest pain or shortness of breath.  Denies any immediate family history of heart attack.  Prior notes/testing Coronary calcium  score 07/2024 value 151, 50th percentile.  LAD, RCA calcifications.  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   Clotting disorder 2021   Depression 2015    Past Surgical History:  Procedure Laterality Date   ADENOIDECTOMY     COLONOSCOPY WITH PROPOFOL  N/A 02/26/2024   Procedure: COLONOSCOPY WITH PROPOFOL ;  Surgeon: Tye Millet, DO;  Location: [facility] ENDOSCOPY;  Service: General;  Laterality: N/A;   INGUINAL HERNIA REPAIR Left    INGUINAL LYMPH NODE BIOPSY Right    removal of lymph node   POLYPECTOMY  02/26/2024   Procedure: POLYPECTOMY;  Surgeon: Tye Millet, DO;  Location: [facility] ENDOSCOPY;  Service: General;;   TONSILLECTOMY      Current Medications: Current Meds  Medication Sig   albuterol  (VENTOLIN  HFA) 108 (90 Base) MCG/ACT inhaler Inhale 2 puffs into the lungs every 6 (six) hours as needed for wheezing or shortness of breath.   aspirin  EC 81  MG tablet Take 1 tablet (81 mg total) by mouth daily. Swallow whole.   cetirizine (ZYRTEC) 10 MG chewable tablet Chew 10 mg by mouth daily.   losartan (COZAAR) 50 MG tablet Take 1 tablet (50 mg total) by mouth daily.   meloxicam (MOBIC) 15 MG tablet Take 15 mg by mouth daily.   rosuvastatin  (CRESTOR ) 20 MG tablet Take 1 tablet (20 mg total) by mouth daily.     Allergies:   Patient has no known allergies.   Social History   Socioeconomic History   Marital status: Married    Spouse name: Not on file   Number of children: Not on file   Years of education: Not on file   Highest education level: Professional school degree (e.g., MD, DDS, DVM, JD)  Occupational History   Not on file  Tobacco Use   Smoking status: Never   Smokeless tobacco: Never  Vaping Use   Vaping status: Never Used  Substance and Sexual Activity   Alcohol use: Yes    Alcohol/week: 4.0 standard drinks of alcohol    Types: 2 Cans of beer, 2 Standard drinks or equivalent per week    Comment: social drinker   Drug use: Never   Sexual activity: Not Currently  Other Topics Concern   Not on file  Social History Narrative   Not on file  Social Drivers of Corporate investment banker Strain: Low Risk  (09/20/2024)   Overall Financial Resource Strain (CARDIA)    Difficulty of Paying Living Expenses: Not hard at all  Food Insecurity: No Food Insecurity (09/20/2024)   Hunger Vital Sign    Worried About Running Out of Food in the Last Year: Never true    Ran Out of Food in the Last Year: Never true  Transportation Needs: No Transportation Needs (09/20/2024)   PRAPARE - Administrator, Civil Service (Medical): No    Lack of Transportation (Non-Medical): No  Physical Activity: Sufficiently Active (09/20/2024)   Exercise Vital Sign    Days of Exercise per Week: 5 days    Minutes of Exercise per Session: 30 min  Stress: No Stress Concern Present (09/20/2024)   [employer] of Occupational Health -  Occupational Stress Questionnaire    Feeling of Stress: Only a little  Social Connections: Moderately Integrated (09/20/2024)   Social Connection and Isolation Panel    Frequency of Communication with Friends and Family: Once a week    Frequency of Social Gatherings with Friends and Family: Once a week    Attends Religious Services: More than 4 times per year    Active Member of [employer] or Organizations: Yes    Attends Engineer, structural: More than 4 times per year    Marital Status: Married     Family History: The patient's family history includes Diabetes in his mother; Heart disease in his paternal aunt and paternal grandfather; Hyperlipidemia in his paternal grandfather; Multiple myeloma in his brother; Obesity in his mother; Stroke in his paternal grandfather; Vision loss in his father.  ROS:   Please see the history of present illness.     All other systems reviewed and are negative.  EKGs/Labs/Other Studies Reviewed:    The following studies were reviewed today:  EKG Interpretation Date/Time:  Wednesday September 30 2024 09:38:18 EDT Ventricular Rate:  58 PR Interval:  156 QRS Duration:  82 QT Interval:  442 QTC Calculation: 433 R Axis:   -11  Text Interpretation: Sinus bradycardia Confirmed by Darliss Rogue ([id number]) on 09/30/2024 10:10:07 AM    Recent Labs: 07/09/2024: ALT 14; BUN 14; Creat 0.94; Hemoglobin 14.3; Platelets 133; Potassium 4.4; Sodium 142  Recent Lipid Panel    Component Value Date/Time   CHOL 179 07/09/2024 0810   TRIG 131 07/09/2024 0810   HDL 36 (L) 07/09/2024 0810   CHOLHDL 5.0 (H) 07/09/2024 0810   LDLCALC 118 (H) 07/09/2024 0810     Risk Assessment/Calculations:             Physical Exam:    VS:  BP 130/68 (BP Location: Left Arm, Patient Position: Sitting, Cuff Size: Normal)   Pulse 78   Ht 6' 2 (1.88 m)   Wt 246 lb (111.6 kg)   SpO2 98%   BMI 31.58 kg/m     Wt Readings from Last 3 Encounters:  09/30/24 246 lb  (111.6 kg)  09/24/24 245 lb 9.6 oz (111.4 kg)  08/24/24 240 lb (108.9 kg)     GEN:  Well nourished, well developed in no acute distress HEENT: Normal NECK: No JVD; No carotid bruits CARDIAC: RRR, no murmurs, rubs, gallops RESPIRATORY:  Clear to auscultation without rales, wheezing or rhonchi  ABDOMEN: Soft, non-tender, non-distended MUSCULOSKELETAL:  No edema; No deformity  SKIN: Warm and dry NEUROLOGIC:  Alert and oriented x 3 PSYCHIATRIC:  Normal affect   ASSESSMENT:  1. Coronary artery disease involving native heart without angina pectoris, unspecified vessel or lesion type   2. Pure hypercholesterolemia   3. Elevated lipoprotein(a)    PLAN:    In order of problems listed above:  CAD, LAD, RCA calcifications on chest CT, calcium  score 151, 50th percentile.  Obtain echocardiogram.  Agree with aspirin  81 mg daily, Crestor  20 mg daily. Hyperlipidemia, goal LDL less than 70, ideally less than 55 with elevated lipoprotein a.  Crestor  20 mg daily.  Repeat lipid panel in 3 months. Elevated lipoprotein a, coronary calcification, aspirin , Crestor  as above.  Follow-up in 3 months      Medication Adjustments/Labs and Tests Ordered: Current medicines are reviewed at length with the patient today.  Concerns regarding medicines are outlined above.  Orders Placed This Encounter  Procedures   Lipid panel   EKG 12-Lead   ECHOCARDIOGRAM COMPLETE   No orders of the defined types were placed in this encounter.   Patient Instructions  Medication Instructions:  Your physician recommends that you continue on your current medications as directed. Please refer to the Current Medication list given to you today.   *If you need a refill on your cardiac medications before your next appointment, please call your pharmacy*  Lab Work: Your provider would like for you to return in DAY OF ECHO to have the following labs drawn: FASTING LIPID PANEL.  Please go to [facility] [address] ([facility]) [address], [place] [address] You do not need an appointment.  They are open from 8 am- 4:30 pm.  Lunch from 1:00 pm- 2:00 pm You DO need to be fasting.  If you have labs (blood work) drawn today and your tests are completely normal, you will receive your results only by: MyChart Message (if you have MyChart) OR A paper copy in the mail If you have any lab test that is abnormal or we need to change your treatment, we will call you to review the results.  Testing/Procedures: Your physician has requested that you have an echocardiogram. Echocardiography is a painless test that uses sound waves to create images of your heart. It provides your doctor with information about the size and shape of your heart and how well your heart's chambers and valves are working.   You may receive an ultrasound enhancing agent through an IV if needed to better visualize your heart during the echo. This procedure takes approximately one hour.  There are no restrictions for this procedure.  This will take place at [address] [facility]) [address], [place] [address]  Please note: We ask at that you not bring children with you during ultrasound (echo/ vascular) testing. Due to room size and safety concerns, children are not allowed in the ultrasound rooms during exams. Our front office staff cannot provide observation of children in our lobby area while testing is being conducted. An adult accompanying a patient to their appointment will only be allowed in the ultrasound room at the discretion of the ultrasound technician under special circumstances. We apologize for any inconvenience.   Follow-Up: At [facility], you and your health needs are our priority.  As part of our continuing mission to provide you with exceptional heart care, our providers are all part of one team.  This team includes your primary Cardiologist (physician) and Advanced Practice  Providers or APPs (Physician Assistants and Nurse Practitioners) who all work together to provide you with the care you need, when you need  it.  Your next appointment:   3 month(s)  Provider:   You may see Dr Darliss or one of the following Advanced Practice Providers on your designated Care Team:   Lonni Meager, NP Lesley Maffucci, PA-C Bernardino Bring, PA-C Cadence [place], PA-C Tylene Lunch, NP Barnie Hila, NP    We recommend signing up for the patient portal called MyChart.  Sign up information is provided on this After Visit Summary.  MyChart is used to connect with patients for Virtual Visits (Telemedicine).  Patients are able to view lab/test results, encounter notes, upcoming appointments, etc.  Non-urgent messages can be sent to your provider as well.   To learn more about what you can do with MyChart, go to [email or web address].             Signed, Redell Darliss, MD  09/30/2024 10:58 AM    [facility]

## 2024-09-30 NOTE — Patient Instructions (Signed)
 Medication Instructions:  Your physician recommends that you continue on your current medications as directed. Please refer to the Current Medication list given to you today.   *If you need a refill on your cardiac medications before your next appointment, please call your pharmacy*  Lab Work: Your provider would like for you to return in DAY OF ECHO to have the following labs drawn: FASTING LIPID PANEL.  Please go to [facility] [address] (Medical [facility]) [address], [place] [address] You do not need an appointment.  They are open from 8 am- 4:30 pm.  Lunch from 1:00 pm- 2:00 pm You DO need to be fasting.  If you have labs (blood work) drawn today and your tests are completely normal, you will receive your results only by: MyChart Message (if you have MyChart) OR A paper copy in the mail If you have any lab test that is abnormal or we need to change your treatment, we will call you to review the results.  Testing/Procedures: Your physician has requested that you have an echocardiogram. Echocardiography is a painless test that uses sound waves to create images of your heart. It provides your doctor with information about the size and shape of your heart and how well your heart's chambers and valves are working.   You may receive an ultrasound enhancing agent through an IV if needed to better visualize your heart during the echo. This procedure takes approximately one hour.  There are no restrictions for this procedure.  This will take place at [address] [facility]) [address], [place] [address]  Please note: We ask at that you not bring children with you during ultrasound (echo/ vascular) testing. Due to room size and safety concerns, children are not allowed in the ultrasound rooms during exams. Our front office staff cannot provide observation of children in our lobby area while testing is being conducted. An adult accompanying a patient to their  appointment will only be allowed in the ultrasound room at the discretion of the ultrasound technician under special circumstances. We apologize for any inconvenience.   Follow-Up: At [facility], you and your health needs are our priority.  As part of our continuing mission to provide you with exceptional heart care, our providers are all part of one team.  This team includes your primary Cardiologist (physician) and Advanced Practice Providers or APPs (Physician Assistants and Nurse Practitioners) who all work together to provide you with the care you need, when you need it.  Your next appointment:   3 month(s)  Provider:   You may see Dr Darliss or one of the following Advanced Practice Providers on your designated Care Team:   Lonni Meager, NP Lesley Maffucci, PA-C Bernardino Bring, PA-C Cadence [place], PA-C Tylene Lunch, NP Barnie Hila, NP    We recommend signing up for the patient portal called MyChart.  Sign up information is provided on this After Visit Summary.  MyChart is used to connect with patients for Virtual Visits (Telemedicine).  Patients are able to view lab/test results, encounter notes, upcoming appointments, etc.  Non-urgent messages can be sent to your provider as well.   To learn more about what you can do with MyChart, go to [email or web address].

## 2024-10-06 ENCOUNTER — Telehealth: Payer: Self-pay

## 2024-10-06 NOTE — Telephone Encounter (Unsigned)
 Copied from CRM [id number]. Topic: Clinical - Medication Refill >> Oct 06, 2024  1:30 PM Harlene ORN wrote: Medication: meloxicam (MOBIC) 15 MG tablet  Has the patient contacted their pharmacy? Yes (Agent: If no, request that the patient contact the pharmacy for the refill. If patient does not wish to contact the pharmacy document the reason why and proceed with request.) (Agent: If yes, when and what did the pharmacy advise?)  This is the patient's preferred pharmacy:  [facility] - [place], [place] - [address] [place] [address] Phone: [phone] Fax: [phone]  Is this the correct pharmacy for this prescription? Yes If no, delete pharmacy and type the correct one.   Has the prescription been filled recently? No  Is the patient out of the medication? No  Has the patient been seen for an appointment in the last year OR does the patient have an upcoming appointment? Yes  Can we respond through MyChart? Yes  Agent: Please be advised that Rx refills may take up to 3 business days. We ask that you follow-up with your pharmacy.

## 2024-10-07 ENCOUNTER — Other Ambulatory Visit: Payer: Self-pay

## 2024-10-07 MED ORDER — MELOXICAM 15 MG PO TABS: 15.0000 mg | ORAL_TABLET | Freq: Every day | ORAL | 1 refills | Status: AC

## 2024-10-20 ENCOUNTER — Other Ambulatory Visit

## 2024-10-20 DIAGNOSIS — Z006 Encounter for examination for normal comparison and control in clinical research program: Secondary | ICD-10-CM

## 2024-10-30 LAB — GENECONNECT MOLECULAR SCREEN: Genetic Analysis Overall Interpretation: NEGATIVE

## 2024-11-17 ENCOUNTER — Ambulatory Visit: Attending: Cardiology

## 2024-11-17 DIAGNOSIS — I2510 Atherosclerotic heart disease of native coronary artery without angina pectoris: Secondary | ICD-10-CM

## 2024-11-17 LAB — ECHOCARDIOGRAM COMPLETE
AR max vel: 2.81 cm2
AV Area VTI: 2.65 cm2
AV Area mean vel: 2.52 cm2
AV Mean grad: 3 mmHg
AV Peak grad: 5.1 mmHg
Ao pk vel: 1.13 m/s
Area-P 1/2: 3.08 cm2
S' Lateral: 3.1 cm

## 2024-11-19 ENCOUNTER — Ambulatory Visit: Payer: Self-pay | Admitting: Cardiology

## 2024-12-31 ENCOUNTER — Ambulatory Visit: Attending: Cardiology | Admitting: Cardiology

## 2024-12-31 ENCOUNTER — Encounter: Payer: Self-pay | Admitting: Cardiology

## 2024-12-31 VITALS — BP 120/68 | HR 68 | Ht 74.0 in | Wt 250.0 lb

## 2024-12-31 DIAGNOSIS — E7841 Elevated Lipoprotein(a): Secondary | ICD-10-CM

## 2024-12-31 DIAGNOSIS — I2510 Atherosclerotic heart disease of native coronary artery without angina pectoris: Secondary | ICD-10-CM | POA: Diagnosis not present

## 2024-12-31 DIAGNOSIS — E7800 Pure hypercholesterolemia, unspecified: Secondary | ICD-10-CM

## 2024-12-31 NOTE — Progress Notes (Signed)
 " Cardiology Office Note:    Date:  12/31/2024   ID:  Bryan Gomez, DOB [birth year], MRN [id number]  PCP:  Everlene Parris LABOR, MD   [facility] Providers Cardiologist:  None     Referring MD: Everlene Parris LABOR, MD   Chief Complaint  Patient presents with   Follow-up    3 month follow up pt has been doing well with no complaints of chest pain, chest pressure or SOB, medication reviewed verbally with patient    History of Present Illness:    Bryan Gomez is a 69 y.o. male with a hx of CAD (LAD, RCA calcifications on chest CT, calcium  score 151, 50th percentile ), hyperlipidemia presenting for follow-up.  Previously seen due to elevated lipoprotein a and elevated LDL.  Started on Crestor  20 mg daily, tolerating medication with no adverse effect.  Denies chest pain or shortness of breath.  Has no concerns at this time.  Prior notes/testing Coronary calcium  score 07/2024 value 151, 50th percentile.  LAD, RCA calcifications.  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   Clotting disorder 2021   Depression 2015    Past Surgical History:  Procedure Laterality Date   ADENOIDECTOMY     COLONOSCOPY WITH PROPOFOL  N/A 02/26/2024   Procedure: COLONOSCOPY WITH PROPOFOL ;  Surgeon: Tye Millet, DO;  Location: [facility] ENDOSCOPY;  Service: General;  Laterality: N/A;   INGUINAL HERNIA REPAIR Left    INGUINAL LYMPH NODE BIOPSY Right    removal of lymph node   POLYPECTOMY  02/26/2024   Procedure: POLYPECTOMY;  Surgeon: Tye Millet, DO;  Location: [facility] ENDOSCOPY;  Service: General;;   TONSILLECTOMY      Current Medications: Current Meds  Medication Sig   albuterol  (VENTOLIN  HFA) 108 (90 Base) MCG/ACT inhaler Inhale 2 puffs into the lungs every 6 (six) hours as needed for wheezing or shortness of breath.   aspirin  EC 81 MG tablet Take 1 tablet (81 mg total) by mouth daily. Swallow whole.   cetirizine (ZYRTEC) 10 MG chewable tablet Chew 10  mg by mouth daily.   losartan  (COZAAR ) 50 MG tablet Take 1 tablet (50 mg total) by mouth daily.   meloxicam  (MOBIC ) 15 MG tablet Take 1 tablet (15 mg total) by mouth daily.   rosuvastatin  (CRESTOR ) 20 MG tablet Take 1 tablet (20 mg total) by mouth daily.     Allergies:   Patient has no known allergies.   Social History   Socioeconomic History   Marital status: Married    Spouse name: Not on file   Number of children: Not on file   Years of education: Not on file   Highest education level: Professional school degree (e.g., MD, DDS, DVM, JD)  Occupational History   Not on file  Tobacco Use   Smoking status: Never   Smokeless tobacco: Never  Vaping Use   Vaping status: Never Used  Substance and Sexual Activity   Alcohol use: Yes    Alcohol/week: 4.0 standard drinks of alcohol    Types: 2 Cans of beer, 2 Standard drinks or equivalent per week    Comment: social drinker   Drug use: Never   Sexual activity: Not Currently  Other Topics Concern   Not on file  Social History Narrative   Not on file   Social Drivers of Health   Tobacco Use: Low Risk (12/31/2024)   Patient History    Smoking Tobacco Use:  Never    Smokeless Tobacco Use: Never    Passive Exposure: Not on file  Financial Resource Strain: Low Risk (09/20/2024)   Overall Financial Resource Strain (CARDIA)    Difficulty of Paying Living Expenses: Not hard at all  Food Insecurity: No Food Insecurity (09/20/2024)   Epic    Worried About Programme Researcher, Broadcasting/film/video in the Last Year: Never true    Ran Out of Food in the Last Year: Never true  Transportation Needs: No Transportation Needs (09/20/2024)   Epic    Lack of Transportation (Medical): No    Lack of Transportation (Non-Medical): No  Physical Activity: Sufficiently Active (09/20/2024)   Exercise Vital Sign    Days of Exercise per Week: 5 days    Minutes of Exercise per Session: 30 min  Stress: No Stress Concern Present (09/20/2024)   [employer] of  Occupational Health - Occupational Stress Questionnaire    Feeling of Stress: Only a little  Social Connections: Moderately Integrated (09/20/2024)   Social Connection and Isolation Panel    Frequency of Communication with Friends and Family: Once a week    Frequency of Social Gatherings with Friends and Family: Once a week    Attends Religious Services: More than 4 times per year    Active Member of Clubs or Organizations: Yes    Attends Banker Meetings: More than 4 times per year    Marital Status: Married  Depression (PHQ2-9): Medium Risk (07/30/2024)   Depression (PHQ2-9)    PHQ-2 Score: 5  Alcohol Screen: Low Risk (09/20/2024)   Alcohol Screen    Last Alcohol Screening Score (AUDIT): 4  Housing: Low Risk (09/20/2024)   Epic    Unable to Pay for Housing in the Last Year: No    Number of Times Moved in the Last Year: 0    Homeless in the Last Year: No  Utilities: Not At Risk (12/31/2023)   Received from [facility] Utilities    Threatened with loss of utilities: No  Health Literacy: Not on file     Family History: The patient's family history includes Diabetes in his mother; Heart disease in his paternal aunt and paternal grandfather; Hyperlipidemia in his paternal grandfather; Multiple myeloma in his brother; Obesity in his mother; Stroke in his paternal grandfather; Vision loss in his father.  ROS:   Please see the history of present illness.     All other systems reviewed and are negative.  EKGs/Labs/Other Studies Reviewed:    The following studies were reviewed today:       Recent Labs: 07/09/2024: ALT 14; BUN 14; Creat 0.94; Hemoglobin 14.3; Platelets 133; Potassium 4.4; Sodium 142  Recent Lipid Panel    Component Value Date/Time   CHOL 179 07/09/2024 0810   TRIG 131 07/09/2024 0810   HDL 36 (L) 07/09/2024 0810   CHOLHDL 5.0 (H) 07/09/2024 0810   LDLCALC 118 (H) 07/09/2024 0810     Risk Assessment/Calculations:              Physical Exam:    VS:  BP 120/68 (BP Location: Left Arm, Patient Position: Sitting, Cuff Size: Normal)   Pulse 68   Ht 6' 2 (1.88 m)   Wt 250 lb (113.4 kg)   SpO2 99%   BMI 32.10 kg/m     Wt Readings from Last 3 Encounters:  12/31/24 250 lb (113.4 kg)  09/30/24 246 lb (111.6 kg)  09/24/24 245 lb 9.6 oz (111.4  kg)     GEN:  Well nourished, well developed in no acute distress HEENT: Normal NECK: No JVD; No carotid bruits CARDIAC: RRR, no murmurs, rubs, gallops RESPIRATORY:  Clear to auscultation without rales, wheezing or rhonchi  ABDOMEN: Soft, non-tender, non-distended MUSCULOSKELETAL:  No edema; No deformity  SKIN: Warm and dry NEUROLOGIC:  Alert and oriented x 3 PSYCHIATRIC:  Normal affect   ASSESSMENT:    1. Coronary artery disease involving native heart without angina pectoris, unspecified vessel or lesion type   2. Pure hypercholesterolemia   3. Elevated lipoprotein(a)    PLAN:    In order of problems listed above:  CAD, LAD, RCA calcifications on chest CT, calcium  score 151, 50th percentile.  Echo 12/25 EF 60 to 65%.  Continue aspirin  81 mg daily, Crestor  20 mg daily. Hyperlipidemia, goal LDL less than 70, ideally less than 55 with elevated lipoprotein a.  Continue Crestor  20 mg daily.  Repeat lipid panel.  Titrate Crestor  if cholesterol not at goal. Elevated lipoprotein a, coronary calcification, aspirin , Crestor  as above.  Follow-up in 6 months      Medication Adjustments/Labs and Tests Ordered: Current medicines are reviewed at length with the patient today.  Concerns regarding medicines are outlined above.  Orders Placed This Encounter  Procedures   Lipid panel   No orders of the defined types were placed in this encounter.   Patient Instructions  Medication Instructions:  Your physician recommends that you continue on your current medications as directed. Please refer to the Current Medication list given to you today.   *If you need a  refill on your cardiac medications before your next appointment, please call your pharmacy*  Lab Work: Your provider would like for you to return to have the following labs drawn: FASTING LIPID PANEL.   Please go to [facility] [address] ([facility]) [address], [place] [address] You do not need an appointment.  They are open from 8 am- 4:30 pm.  Lunch from 1:00 pm- 2:00 pm You DO need to be fasting.   You may also go to one of the following [facility]:  [address] [place], [place] [address] Phone: [phone] Lab hours: Mon-Fri 8 am- 5 pm    Lunch 12 pm- 1 pm  [address] [place],  [place]  [address]  [place]  Phone: [phone] Lab hours: 7 am- 4 pm Lunch 12 pm-1 pm   [address] [place],  [place]  [address]  [place]  Phone: [phone] Lab hours: Mon-Fri 8 am- 5 pm    Lunch 12 pm- 1 pm  If you have labs (blood work) drawn today and your tests are completely normal, you will receive your results only by: MyChart Message (if you have MyChart) OR A paper copy in the mail If you have any lab test that is abnormal or we need to change your treatment, we will call you to review the results.  Testing/Procedures: No test ordered today   Follow-Up: At [facility], you and your health needs are our priority.  As part of our continuing mission to provide you with exceptional heart care, our providers are all part of one team.  This team includes your primary Cardiologist (physician) and Advanced Practice Providers or APPs (Physician Assistants and Nurse Practitioners) who all work together to provide you with the care you need, when you need it.  Your next appointment:   6 month(s)  Provider:   You will see one of the following Advanced  Practice Providers on your designated Care Team:   Lonni Meager, NP Lesley Maffucci, PA-C Bernardino Bring, PA-C Cadence [place], PA-C Tylene Lunch, NP Barnie Hila, NP  FOLLOW UP WITH ELECTROPHYSIOLOGY               Signed, Redell Cave, MD  12/31/2024 12:11 PM    [facility] "

## 2024-12-31 NOTE — Patient Instructions (Signed)
 Medication Instructions:  Your physician recommends that you continue on your current medications as directed. Please refer to the Current Medication list given to you today.   *If you need a refill on your cardiac medications before your next appointment, please call your pharmacy*  Lab Work: Your provider would like for you to return to have the following labs drawn: FASTING LIPID PANEL.   Please go to [facility] [address] ([facility] [address], [place] [address] You do not need an appointment.  They are open from 8 am- 4:30 pm.  Lunch from 1:00 pm- 2:00 pm You DO need to be fasting.   You may also go to one of the following [facility]:  [address] [place], [place] [address] Phone: [phone] Lab hours: Mon-Fri 8 am- 5 pm    Lunch 12 pm- 1 pm  [address] [place],  [place]  [address]  [place]  Phone: [phone] Lab hours: 7 am- 4 pm Lunch 12 pm-1 pm   [address] [place],  [place]  [address]  [place]  Phone: [phone] Lab hours: Mon-Fri 8 am- 5 pm    Lunch 12 pm- 1 pm  If you have labs (blood work) drawn today and your tests are completely normal, you will receive your results only by: MyChart Message (if you have MyChart) OR A paper copy in the mail If you have any lab test that is abnormal or we need to change your treatment, we will call you to review the results.  Testing/Procedures: No test ordered today   Follow-Up: At [facility], you and your health needs are our priority.  As part of our continuing mission to provide you with exceptional heart care, our providers are all part of one team.  This team includes your primary Cardiologist (physician) and Advanced Practice Providers or APPs (Physician Assistants and Nurse Practitioners) who all work together to provide you with the care you need, when you need it.  Your next appointment:   6 month(s)  Provider:   You will see one of the following Advanced Practice Providers on  your designated Care Team:   Lonni Meager, NP Lesley Maffucci, PA-C Bernardino Bring, PA-C Cadence [place], PA-C Tylene Lunch, NP Barnie Hila, NP  FOLLOW UP WITH ELECTROPHYSIOLOGY

## 2025-01-01 ENCOUNTER — Other Ambulatory Visit: Payer: Self-pay

## 2025-01-01 ENCOUNTER — Other Ambulatory Visit: Payer: Self-pay | Admitting: *Deleted

## 2025-01-01 DIAGNOSIS — I159 Secondary hypertension, unspecified: Secondary | ICD-10-CM

## 2025-01-02 LAB — LIPID PANEL W/O CHOL/HDL RATIO
Cholesterol, Total: 109 mg/dL (ref 100–199)
HDL: 43 mg/dL
LDL Chol Calc (NIH): 51 mg/dL (ref 0–99)
Triglycerides: 73 mg/dL (ref 0–149)
VLDL Cholesterol Cal: 15 mg/dL (ref 5–40)

## 2025-01-02 LAB — CBC WITH DIFFERENTIAL/PLATELET
Basophils Absolute: 0 10*3/uL (ref 0.0–0.2)
Basos: 1 %
EOS (ABSOLUTE): 0.2 10*3/uL (ref 0.0–0.4)
Eos: 4 %
Hematocrit: 46.5 % (ref 37.5–51.0)
Hemoglobin: 15.6 g/dL (ref 13.0–17.7)
Immature Grans (Abs): 0 10*3/uL (ref 0.0–0.1)
Immature Granulocytes: 0 %
Lymphocytes Absolute: 0.9 10*3/uL (ref 0.7–3.1)
Lymphs: 19 %
MCH: 31.7 pg (ref 26.6–33.0)
MCHC: 33.5 g/dL (ref 31.5–35.7)
MCV: 95 fL (ref 79–97)
Monocytes Absolute: 0.5 10*3/uL (ref 0.1–0.9)
Monocytes: 11 %
Neutrophils Absolute: 3.3 10*3/uL (ref 1.4–7.0)
Neutrophils: 65 %
Platelets: 135 10*3/uL — ABNORMAL LOW (ref 150–450)
RBC: 4.92 x10E6/uL (ref 4.14–5.80)
RDW: 13.2 % (ref 11.6–15.4)
WBC: 5 10*3/uL (ref 3.4–10.8)

## 2025-01-13 ENCOUNTER — Ambulatory Visit

## 2025-02-02 ENCOUNTER — Ambulatory Visit: Admitting: Family Medicine

## 2025-02-02 ENCOUNTER — Ambulatory Visit

## 2025-06-04 ENCOUNTER — Ambulatory Visit: Admitting: Nurse Practitioner
